# Patient Record
Sex: Female | Born: 1980
Health system: Southern US, Community
[De-identification: ages and names within clinical notes are randomized; demographics above are authoritative.]

## PROBLEM LIST (undated history)

## (undated) DIAGNOSIS — E669 Obesity, unspecified: Secondary | ICD-10-CM

## (undated) DIAGNOSIS — F32A Depression, unspecified: Secondary | ICD-10-CM

## (undated) DIAGNOSIS — K219 Gastro-esophageal reflux disease without esophagitis: Secondary | ICD-10-CM

## (undated) DIAGNOSIS — E785 Hyperlipidemia, unspecified: Secondary | ICD-10-CM

## (undated) DIAGNOSIS — I10 Essential (primary) hypertension: Secondary | ICD-10-CM

## (undated) DIAGNOSIS — L309 Dermatitis, unspecified: Secondary | ICD-10-CM

## (undated) DIAGNOSIS — F329 Major depressive disorder, single episode, unspecified: Secondary | ICD-10-CM

## (undated) HISTORY — DX: Gastro-esophageal reflux disease without esophagitis: K21.9

## (undated) HISTORY — DX: Depression, unspecified: F32.A

## (undated) HISTORY — DX: Dermatitis, unspecified: L30.9

## (undated) HISTORY — DX: Hyperlipidemia, unspecified: E78.5

## (undated) HISTORY — DX: Obesity, unspecified: E66.9

## (undated) HISTORY — DX: Essential (primary) hypertension: I10

## (undated) HISTORY — DX: Major depressive disorder, single episode, unspecified: F32.9

---
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
## 2001-09-10 ENCOUNTER — Emergency Department (HOSPITAL_COMMUNITY): Admission: EM | Admit: 2001-09-10 | Discharge: 2001-09-10 | Payer: Self-pay | Admitting: *Deleted

## 2003-02-10 ENCOUNTER — Emergency Department (HOSPITAL_COMMUNITY): Admission: EM | Admit: 2003-02-10 | Discharge: 2003-02-10 | Payer: Self-pay | Admitting: Emergency Medicine

## 2003-02-10 ENCOUNTER — Encounter: Payer: Self-pay | Admitting: Emergency Medicine

## 2004-05-06 ENCOUNTER — Emergency Department (HOSPITAL_COMMUNITY): Admission: EM | Admit: 2004-05-06 | Discharge: 2004-05-06 | Payer: Self-pay | Admitting: Emergency Medicine

## 2004-09-04 ENCOUNTER — Emergency Department (HOSPITAL_COMMUNITY): Admission: EM | Admit: 2004-09-04 | Discharge: 2004-09-05 | Payer: Self-pay | Admitting: *Deleted

## 2004-10-30 ENCOUNTER — Ambulatory Visit: Payer: Self-pay | Admitting: Internal Medicine

## 2004-11-21 ENCOUNTER — Ambulatory Visit: Payer: Self-pay | Admitting: Internal Medicine

## 2004-12-03 ENCOUNTER — Ambulatory Visit: Payer: Self-pay | Admitting: Internal Medicine

## 2005-01-23 ENCOUNTER — Emergency Department (HOSPITAL_COMMUNITY): Admission: EM | Admit: 2005-01-23 | Discharge: 2005-01-23 | Payer: Self-pay | Admitting: Emergency Medicine

## 2005-04-29 ENCOUNTER — Emergency Department (HOSPITAL_COMMUNITY): Admission: EM | Admit: 2005-04-29 | Discharge: 2005-04-29 | Payer: Self-pay | Admitting: Emergency Medicine

## 2005-05-27 ENCOUNTER — Ambulatory Visit: Payer: Self-pay | Admitting: Internal Medicine

## 2005-08-21 ENCOUNTER — Emergency Department (HOSPITAL_COMMUNITY): Admission: EM | Admit: 2005-08-21 | Discharge: 2005-08-21 | Payer: Self-pay | Admitting: *Deleted

## 2005-12-02 ENCOUNTER — Ambulatory Visit: Payer: Self-pay | Admitting: Internal Medicine

## 2005-12-25 ENCOUNTER — Emergency Department (HOSPITAL_COMMUNITY): Admission: EM | Admit: 2005-12-25 | Discharge: 2005-12-25 | Payer: Self-pay | Admitting: Emergency Medicine

## 2006-04-07 ENCOUNTER — Ambulatory Visit: Payer: Self-pay | Admitting: Internal Medicine

## 2006-06-10 ENCOUNTER — Ambulatory Visit (HOSPITAL_COMMUNITY): Admission: RE | Admit: 2006-06-10 | Discharge: 2006-06-10 | Payer: Self-pay | Admitting: Internal Medicine

## 2006-06-10 ENCOUNTER — Ambulatory Visit: Payer: Self-pay | Admitting: Internal Medicine

## 2006-09-04 DIAGNOSIS — F17200 Nicotine dependence, unspecified, uncomplicated: Secondary | ICD-10-CM

## 2006-09-04 DIAGNOSIS — I10 Essential (primary) hypertension: Secondary | ICD-10-CM | POA: Insufficient documentation

## 2006-09-04 DIAGNOSIS — K219 Gastro-esophageal reflux disease without esophagitis: Secondary | ICD-10-CM | POA: Insufficient documentation

## 2006-09-04 DIAGNOSIS — R0789 Other chest pain: Secondary | ICD-10-CM | POA: Insufficient documentation

## 2006-09-04 DIAGNOSIS — E785 Hyperlipidemia, unspecified: Secondary | ICD-10-CM | POA: Insufficient documentation

## 2006-11-01 ENCOUNTER — Emergency Department (HOSPITAL_COMMUNITY): Admission: EM | Admit: 2006-11-01 | Discharge: 2006-11-01 | Payer: Self-pay | Admitting: Emergency Medicine

## 2007-01-07 ENCOUNTER — Encounter (INDEPENDENT_AMBULATORY_CARE_PROVIDER_SITE_OTHER): Payer: Self-pay | Admitting: *Deleted

## 2007-01-07 ENCOUNTER — Ambulatory Visit: Payer: Self-pay | Admitting: Internal Medicine

## 2007-01-07 DIAGNOSIS — F329 Major depressive disorder, single episode, unspecified: Secondary | ICD-10-CM

## 2007-02-16 ENCOUNTER — Telehealth (INDEPENDENT_AMBULATORY_CARE_PROVIDER_SITE_OTHER): Payer: Self-pay | Admitting: *Deleted

## 2007-03-10 ENCOUNTER — Telehealth (INDEPENDENT_AMBULATORY_CARE_PROVIDER_SITE_OTHER): Payer: Self-pay | Admitting: *Deleted

## 2007-05-06 ENCOUNTER — Emergency Department (HOSPITAL_COMMUNITY): Admission: EM | Admit: 2007-05-06 | Discharge: 2007-05-06 | Payer: Self-pay | Admitting: Emergency Medicine

## 2007-06-09 ENCOUNTER — Ambulatory Visit: Payer: Self-pay | Admitting: Internal Medicine

## 2007-06-09 ENCOUNTER — Encounter (INDEPENDENT_AMBULATORY_CARE_PROVIDER_SITE_OTHER): Payer: Self-pay | Admitting: Internal Medicine

## 2007-06-09 ENCOUNTER — Encounter (INDEPENDENT_AMBULATORY_CARE_PROVIDER_SITE_OTHER): Payer: Self-pay | Admitting: *Deleted

## 2007-06-09 LAB — CONVERTED CEMR LAB
ALT: 24 units/L (ref 0–35)
AST: 19 units/L (ref 0–37)
Albumin: 4.7 g/dL (ref 3.5–5.2)
Alkaline Phosphatase: 77 units/L (ref 39–117)
BUN: 15 mg/dL (ref 6–23)
Bilirubin Urine: NEGATIVE
CO2: 23 meq/L (ref 19–32)
Calcium: 10.2 mg/dL (ref 8.4–10.5)
Chloride: 98 meq/L (ref 96–112)
Creatinine, Ser: 0.85 mg/dL (ref 0.40–1.20)
Glucose, Bld: 90 mg/dL (ref 70–99)
HCT: 47.2 % — ABNORMAL HIGH (ref 36.0–46.0)
Hemoglobin, Urine: NEGATIVE
Hemoglobin: 15.8 g/dL — ABNORMAL HIGH (ref 12.0–15.0)
MCHC: 33.5 g/dL (ref 30.0–36.0)
MCV: 88.7 fL (ref 78.0–100.0)
Nitrite: NEGATIVE
Platelets: 366 10*3/uL (ref 150–400)
Potassium: 4.6 meq/L (ref 3.5–5.3)
Protein, ur: 30 mg/dL — AB
RBC: 5.32 M/uL — ABNORMAL HIGH (ref 3.87–5.11)
RDW: 13.1 % (ref 11.5–14.0)
Sodium: 136 meq/L (ref 135–145)
Specific Gravity, Urine: 1.023 (ref 1.005–1.03)
Total Bilirubin: 0.8 mg/dL (ref 0.3–1.2)
Total Protein: 8 g/dL (ref 6.0–8.3)
Urine Glucose: NEGATIVE mg/dL
Urobilinogen, UA: 0.2 (ref 0.0–1.0)
WBC: 13.7 10*3/uL — ABNORMAL HIGH (ref 4.0–10.5)
pH: 5.5 (ref 5.0–8.0)

## 2007-09-25 ENCOUNTER — Ambulatory Visit: Payer: Self-pay | Admitting: Infectious Disease

## 2007-09-25 ENCOUNTER — Encounter (INDEPENDENT_AMBULATORY_CARE_PROVIDER_SITE_OTHER): Payer: Self-pay | Admitting: Internal Medicine

## 2007-09-25 DIAGNOSIS — J45909 Unspecified asthma, uncomplicated: Secondary | ICD-10-CM | POA: Insufficient documentation

## 2007-09-25 LAB — CONVERTED CEMR LAB
BUN: 10 mg/dL (ref 6–23)
CO2: 22 meq/L (ref 19–32)
Calcium: 9.2 mg/dL (ref 8.4–10.5)
Chloride: 105 meq/L (ref 96–112)
Creatinine, Ser: 0.77 mg/dL (ref 0.40–1.20)
Glucose, Bld: 94 mg/dL (ref 70–99)
Potassium: 4.4 meq/L (ref 3.5–5.3)
Sodium: 139 meq/L (ref 135–145)

## 2008-01-01 ENCOUNTER — Ambulatory Visit: Payer: Self-pay | Admitting: Infectious Disease

## 2008-01-01 ENCOUNTER — Encounter (INDEPENDENT_AMBULATORY_CARE_PROVIDER_SITE_OTHER): Payer: Self-pay | Admitting: *Deleted

## 2008-01-01 LAB — CONVERTED CEMR LAB
BUN: 13 mg/dL (ref 6–23)
CO2: 24 meq/L (ref 19–32)
Calcium: 9.1 mg/dL (ref 8.4–10.5)
Chloride: 108 meq/L (ref 96–112)
Creatinine, Ser: 0.84 mg/dL (ref 0.40–1.20)
Glucose, Bld: 91 mg/dL (ref 70–99)
Potassium: 4.9 meq/L (ref 3.5–5.3)
Sodium: 142 meq/L (ref 135–145)
TSH: 0.906 microintl units/mL (ref 0.350–5.50)

## 2008-01-12 ENCOUNTER — Ambulatory Visit: Payer: Self-pay | Admitting: Internal Medicine

## 2008-01-12 LAB — CONVERTED CEMR LAB: Blood Glucose, AC Bkfst: 113 mg/dL

## 2008-03-23 ENCOUNTER — Ambulatory Visit (HOSPITAL_COMMUNITY): Admission: RE | Admit: 2008-03-23 | Discharge: 2008-03-23 | Payer: Self-pay | Admitting: *Deleted

## 2008-03-23 ENCOUNTER — Encounter: Payer: Self-pay | Admitting: Internal Medicine

## 2008-03-23 ENCOUNTER — Ambulatory Visit: Payer: Self-pay | Admitting: Infectious Diseases

## 2008-03-23 ENCOUNTER — Encounter (INDEPENDENT_AMBULATORY_CARE_PROVIDER_SITE_OTHER): Payer: Self-pay | Admitting: Internal Medicine

## 2008-03-23 LAB — CONVERTED CEMR LAB
ALT: 32 units/L (ref 0–35)
AST: 27 units/L (ref 0–37)
Albumin: 4.4 g/dL (ref 3.5–5.2)
Alkaline Phosphatase: 70 units/L (ref 39–117)
BUN: 10 mg/dL (ref 6–23)
CK-MB: 4 ng/mL (ref 0.3–4.0)
CO2: 28 meq/L (ref 19–32)
Calcium: 9.8 mg/dL (ref 8.4–10.5)
Chloride: 100 meq/L (ref 96–112)
Cholesterol: 200 mg/dL (ref 0–200)
Creatinine, Ser: 0.84 mg/dL (ref 0.40–1.20)
Glucose, Bld: 103 mg/dL — ABNORMAL HIGH (ref 70–99)
HCT: 44.3 % (ref 36.0–46.0)
HDL: 33 mg/dL — ABNORMAL LOW (ref >39)
Hemoglobin: 15.1 g/dL — ABNORMAL HIGH (ref 12.0–15.0)
LDL Cholesterol: 107 mg/dL — ABNORMAL HIGH (ref 0–99)
MCHC: 34.1 g/dL (ref 30.0–36.0)
MCV: 87.5 fL (ref 78.0–100.0)
Platelets: 316 10*3/uL (ref 150–400)
Potassium: 4.7 meq/L (ref 3.5–5.3)
RBC: 5.07 M/uL (ref 3.87–5.11)
RDW: 13 % (ref 11.5–15.5)
Relative Index: 1.4 (ref 0.0–2.5)
Sodium: 137 meq/L (ref 135–145)
TSH: 1.012 microintl units/mL (ref 0.350–4.50)
Total Bilirubin: 0.6 mg/dL (ref 0.3–1.2)
Total CHOL/HDL Ratio: 6.1
Total CK: 293 units/L — ABNORMAL HIGH (ref 7–177)
Total Protein: 7.6 g/dL (ref 6.0–8.3)
Triglycerides: 300 mg/dL — ABNORMAL HIGH (ref <150)
Troponin I: <0.01 ng/mL (ref <0.06)
VLDL: 60 mg/dL — ABNORMAL HIGH (ref 0–40)
WBC: 13.3 10*3/uL — ABNORMAL HIGH (ref 4.0–10.5)

## 2008-04-11 ENCOUNTER — Emergency Department (HOSPITAL_COMMUNITY): Admission: EM | Admit: 2008-04-11 | Discharge: 2008-04-11 | Payer: Self-pay | Admitting: Emergency Medicine

## 2008-05-04 ENCOUNTER — Encounter (INDEPENDENT_AMBULATORY_CARE_PROVIDER_SITE_OTHER): Payer: Self-pay | Admitting: Internal Medicine

## 2008-05-04 ENCOUNTER — Ambulatory Visit: Payer: Self-pay | Admitting: Internal Medicine

## 2008-05-04 DIAGNOSIS — J309 Allergic rhinitis, unspecified: Secondary | ICD-10-CM | POA: Insufficient documentation

## 2008-05-05 LAB — CONVERTED CEMR LAB
ALT: 18 units/L (ref 0–35)
AST: 12 units/L (ref 0–37)
Albumin: 4.4 g/dL (ref 3.5–5.2)
Alkaline Phosphatase: 61 units/L (ref 39–117)
BUN: 9 mg/dL (ref 6–23)
Basophils Absolute: 0 10*3/uL (ref 0.0–0.1)
Basophils Relative: 0 % (ref 0–1)
CO2: 23 meq/L (ref 19–32)
Calcium: 9.3 mg/dL (ref 8.4–10.5)
Chloride: 104 meq/L (ref 96–112)
Creatinine, Ser: 0.76 mg/dL (ref 0.40–1.20)
Eosinophils Absolute: 0.4 10*3/uL (ref 0.0–0.7)
Eosinophils Relative: 3 % (ref 0–5)
Glucose, Bld: 76 mg/dL (ref 70–99)
HCT: 42.9 % (ref 36.0–46.0)
Hemoglobin, Urine: NEGATIVE
Hemoglobin: 14.1 g/dL (ref 12.0–15.0)
Leukocytes, UA: NEGATIVE
Lymphocytes Relative: 21 % (ref 12–46)
Lymphs Abs: 2.6 10*3/uL (ref 0.7–4.0)
MCHC: 32.9 g/dL (ref 30.0–36.0)
MCV: 89.7 fL (ref 78.0–100.0)
Monocytes Absolute: 1.3 10*3/uL — ABNORMAL HIGH (ref 0.1–1.0)
Monocytes Relative: 10 % (ref 3–12)
Neutro Abs: 8.1 10*3/uL — ABNORMAL HIGH (ref 1.7–7.7)
Neutrophils Relative %: 65 % (ref 43–77)
Nitrite: NEGATIVE
Platelets: 361 10*3/uL (ref 150–400)
Potassium: 3.6 meq/L (ref 3.5–5.3)
Protein, ur: NEGATIVE mg/dL
RBC / HPF: NONE SEEN (ref <3)
RBC: 4.78 M/uL (ref 3.87–5.11)
RDW: 13.7 % (ref 11.5–15.5)
Sodium: 140 meq/L (ref 135–145)
Specific Gravity, Urine: 1.026 (ref 1.005–1.03)
Total Bilirubin: 0.5 mg/dL (ref 0.3–1.2)
Total Protein: 7.3 g/dL (ref 6.0–8.3)
Urine Glucose: NEGATIVE mg/dL
Urobilinogen, UA: 1 (ref 0.0–1.0)
WBC, UA: NONE SEEN cells/hpf (ref <3)
WBC: 12.4 10*3/uL — ABNORMAL HIGH (ref 4.0–10.5)
pH: 6.5 (ref 5.0–8.0)

## 2008-09-27 ENCOUNTER — Telehealth (INDEPENDENT_AMBULATORY_CARE_PROVIDER_SITE_OTHER): Payer: Self-pay | Admitting: Internal Medicine

## 2008-10-03 ENCOUNTER — Ambulatory Visit: Payer: Self-pay | Admitting: Infectious Disease

## 2008-10-03 DIAGNOSIS — E669 Obesity, unspecified: Secondary | ICD-10-CM

## 2008-10-03 DIAGNOSIS — L259 Unspecified contact dermatitis, unspecified cause: Secondary | ICD-10-CM | POA: Insufficient documentation

## 2008-10-12 ENCOUNTER — Encounter (INDEPENDENT_AMBULATORY_CARE_PROVIDER_SITE_OTHER): Payer: Self-pay | Admitting: Internal Medicine

## 2008-10-12 ENCOUNTER — Ambulatory Visit (HOSPITAL_COMMUNITY): Admission: RE | Admit: 2008-10-12 | Discharge: 2008-10-12 | Payer: Self-pay | Admitting: Internal Medicine

## 2009-01-16 ENCOUNTER — Telehealth (INDEPENDENT_AMBULATORY_CARE_PROVIDER_SITE_OTHER): Payer: Self-pay | Admitting: Internal Medicine

## 2009-02-09 ENCOUNTER — Emergency Department (HOSPITAL_COMMUNITY): Admission: EM | Admit: 2009-02-09 | Discharge: 2009-02-09 | Payer: Self-pay | Admitting: Emergency Medicine

## 2009-02-13 ENCOUNTER — Ambulatory Visit: Payer: Self-pay | Admitting: Internal Medicine

## 2009-02-13 DIAGNOSIS — M25579 Pain in unspecified ankle and joints of unspecified foot: Secondary | ICD-10-CM

## 2009-03-16 ENCOUNTER — Ambulatory Visit: Payer: Self-pay | Admitting: Sports Medicine

## 2009-03-16 DIAGNOSIS — M25476 Effusion, unspecified foot: Secondary | ICD-10-CM

## 2009-03-16 DIAGNOSIS — M25473 Effusion, unspecified ankle: Secondary | ICD-10-CM

## 2009-03-16 DIAGNOSIS — M21619 Bunion of unspecified foot: Secondary | ICD-10-CM

## 2009-03-16 DIAGNOSIS — S82899A Other fracture of unspecified lower leg, initial encounter for closed fracture: Secondary | ICD-10-CM | POA: Insufficient documentation

## 2009-03-16 DIAGNOSIS — M2140 Flat foot [pes planus] (acquired), unspecified foot: Secondary | ICD-10-CM | POA: Insufficient documentation

## 2009-04-17 ENCOUNTER — Telehealth: Payer: Self-pay | Admitting: *Deleted

## 2009-05-10 ENCOUNTER — Encounter (INDEPENDENT_AMBULATORY_CARE_PROVIDER_SITE_OTHER): Payer: Self-pay | Admitting: *Deleted

## 2009-05-10 ENCOUNTER — Ambulatory Visit: Payer: Self-pay | Admitting: Sports Medicine

## 2009-08-25 ENCOUNTER — Ambulatory Visit: Payer: Self-pay | Admitting: Infectious Diseases

## 2009-08-25 DIAGNOSIS — N949 Unspecified condition associated with female genital organs and menstrual cycle: Secondary | ICD-10-CM

## 2009-08-25 DIAGNOSIS — N925 Other specified irregular menstruation: Secondary | ICD-10-CM | POA: Insufficient documentation

## 2009-08-25 LAB — CONVERTED CEMR LAB
Basophils Absolute: 0 10*3/uL (ref 0.0–0.1)
Basophils Relative: 1 % (ref 0–1)
Eosinophils Absolute: 0.5 10*3/uL (ref 0.0–0.7)
Eosinophils Relative: 6 % — ABNORMAL HIGH (ref 0–5)
HCT: 40.3 % (ref 36.0–46.0)
Hemoglobin: 13.4 g/dL (ref 12.0–15.0)
Lymphocytes Relative: 28 % (ref 12–46)
Lymphs Abs: 2.1 10*3/uL (ref 0.7–4.0)
MCHC: 33.3 g/dL (ref 30.0–36.0)
MCV: 86.1 fL (ref >=78.0)
Monocytes Absolute: 0.6 10*3/uL (ref 0.1–1.0)
Monocytes Relative: 9 % (ref 3–12)
Neutro Abs: 4.2 10*3/uL (ref 1.7–7.7)
Neutrophils Relative %: 57 % (ref 43–77)
Platelets: 382 10*3/uL (ref 150–400)
Prolactin: 6.7 ng/mL
RBC: 4.68 M/uL (ref 3.87–5.11)
RDW: 13.7 % (ref 11.5–15.5)
TSH: 0.76 microintl units/mL (ref 0.350–4.5)
WBC: 7.5 10*3/uL (ref 4.0–10.5)

## 2009-09-22 ENCOUNTER — Encounter: Admission: RE | Admit: 2009-09-22 | Discharge: 2009-09-22 | Payer: Self-pay | Admitting: Podiatrist

## 2010-01-24 ENCOUNTER — Telehealth (INDEPENDENT_AMBULATORY_CARE_PROVIDER_SITE_OTHER): Payer: Self-pay | Admitting: Internal Medicine

## 2010-01-24 ENCOUNTER — Emergency Department (HOSPITAL_COMMUNITY): Admission: EM | Admit: 2010-01-24 | Discharge: 2010-01-24 | Payer: Self-pay | Admitting: Emergency Medicine

## 2010-03-14 ENCOUNTER — Emergency Department (HOSPITAL_COMMUNITY): Admission: EM | Admit: 2010-03-14 | Discharge: 2010-03-14 | Payer: Self-pay | Admitting: Emergency Medicine

## 2010-04-30 ENCOUNTER — Emergency Department (HOSPITAL_COMMUNITY): Admission: EM | Admit: 2010-04-30 | Discharge: 2010-04-30 | Payer: Self-pay | Admitting: Emergency Medicine

## 2010-07-25 ENCOUNTER — Emergency Department (HOSPITAL_COMMUNITY): Admission: EM | Admit: 2010-07-25 | Discharge: 2010-07-25 | Payer: Self-pay | Admitting: Emergency Medicine

## 2010-09-28 ENCOUNTER — Ambulatory Visit: Admit: 2010-09-28 | Payer: Self-pay

## 2010-10-09 NOTE — Progress Notes (Signed)
Summary: chest pain/ hla  Phone Note Call from Patient   Summary of Call: pt calls and ask for appt, has had L chest pain, L arm numbness since 5/17, pt is referred to Ed asap, not to drive and call [phone] if needed, pt verb understanding Initial call taken by: Marin Roberts RN,  Jan 24, 2010 4:33 PM  Follow-up for Phone Call        Thank you. Follow-up by: Joaquin Courts  MD,  Jan 25, 2010 7:02 AM

## 2010-10-11 ENCOUNTER — Encounter: Payer: Self-pay | Admitting: Internal Medicine

## 2010-11-22 LAB — CBC
HCT: 41.3 % (ref 36.0–46.0)
Hemoglobin: 13.8 g/dL (ref 12.0–15.0)
MCH: 28.9 pg (ref 26.0–34.0)
MCHC: 33.4 g/dL (ref 30.0–36.0)
MCV: 86.4 fL (ref 78.0–100.0)
Platelets: 335 10*3/uL (ref 150–400)
RBC: 4.78 MIL/uL (ref 3.87–5.11)
RDW: 13.8 % (ref 11.5–15.5)
WBC: 13.2 10*3/uL — ABNORMAL HIGH (ref 4.0–10.5)

## 2010-11-22 LAB — DIFFERENTIAL
Basophils Absolute: 0 10*3/uL (ref 0.0–0.1)
Basophils Relative: 0 % (ref 0–1)
Eosinophils Absolute: 0.4 10*3/uL (ref 0.0–0.7)
Eosinophils Relative: 3 % (ref 0–5)
Lymphocytes Relative: 28 % (ref 12–46)
Lymphs Abs: 3.6 10*3/uL (ref 0.7–4.0)
Monocytes Absolute: 1.3 10*3/uL — ABNORMAL HIGH (ref 0.1–1.0)
Monocytes Relative: 10 % (ref 3–12)
Neutro Abs: 7.8 10*3/uL — ABNORMAL HIGH (ref 1.7–7.7)
Neutrophils Relative %: 59 % (ref 43–77)

## 2010-11-22 LAB — BASIC METABOLIC PANEL
BUN: 10 mg/dL (ref 6–23)
CO2: 27 mEq/L (ref 19–32)
Calcium: 9 mg/dL (ref 8.4–10.5)
Chloride: 109 mEq/L (ref 96–112)
Creatinine, Ser: 0.84 mg/dL (ref 0.4–1.2)
GFR calc Af Amer: >60 mL/min (ref >60)
GFR calc non Af Amer: >60 mL/min (ref >60)
Glucose, Bld: 104 mg/dL — ABNORMAL HIGH (ref 70–99)
Potassium: 4.1 mEq/L (ref 3.5–5.1)
Sodium: 140 mEq/L (ref 135–145)

## 2010-11-22 LAB — POCT CARDIAC MARKERS
CKMB, poc: <1 ng/mL — ABNORMAL LOW (ref 1.0–8.0)
Myoglobin, poc: 42 ng/mL (ref 12–200)
Troponin i, poc: <0.05 ng/mL (ref 0.00–0.09)

## 2010-11-25 LAB — URINALYSIS, ROUTINE W REFLEX MICROSCOPIC
Glucose, UA: NEGATIVE mg/dL
Hgb urine dipstick: NEGATIVE
Ketones, ur: 15 mg/dL — AB
Nitrite: NEGATIVE
Protein, ur: NEGATIVE mg/dL
Specific Gravity, Urine: 1.031 — ABNORMAL HIGH (ref 1.005–1.030)
Urobilinogen, UA: 1 mg/dL (ref 0.0–1.0)
pH: 5.5 (ref 5.0–8.0)

## 2010-11-25 LAB — POCT I-STAT, CHEM 8
Calcium, Ion: 1.14 mmol/L (ref 1.12–1.32)
Chloride: 105 mEq/L (ref 96–112)
HCT: 46 % (ref 36.0–46.0)
Sodium: 139 mEq/L (ref 135–145)
TCO2: 25 mmol/L (ref 0–100)

## 2010-11-25 LAB — CBC
HCT: 43.2 % (ref 36.0–46.0)
MCHC: 33.9 g/dL (ref 30.0–36.0)
Platelets: 310 10*3/uL (ref 150–400)
RDW: 14.8 % (ref 11.5–15.5)
WBC: 8.4 10*3/uL (ref 4.0–10.5)

## 2010-11-25 LAB — DIFFERENTIAL
Basophils Absolute: 0.1 10*3/uL (ref 0.0–0.1)
Basophils Relative: 1 % (ref 0–1)
Lymphocytes Relative: 21 % (ref 12–46)
Monocytes Absolute: 0.8 10*3/uL (ref 0.1–1.0)
Neutro Abs: 5.6 10*3/uL (ref 1.7–7.7)
Neutrophils Relative %: 66 % (ref 43–77)

## 2010-11-25 LAB — WET PREP, GENITAL
Clue Cells Wet Prep HPF POC: NONE SEEN
Trich, Wet Prep: NONE SEEN
Yeast Wet Prep HPF POC: NONE SEEN

## 2010-11-25 LAB — GC/CHLAMYDIA PROBE AMP, GENITAL
Chlamydia, DNA Probe: NEGATIVE
GC Probe Amp, Genital: NEGATIVE

## 2010-11-25 LAB — POCT PREGNANCY, URINE: Preg Test, Ur: NEGATIVE

## 2010-11-26 LAB — POCT CARDIAC MARKERS
CKMB, poc: <1 ng/mL — ABNORMAL LOW (ref 1.0–8.0)
Myoglobin, poc: 57.9 ng/mL (ref 12–200)

## 2010-11-26 LAB — BASIC METABOLIC PANEL
CO2: 22 mEq/L (ref 19–32)
Calcium: 8.6 mg/dL (ref 8.4–10.5)
Creatinine, Ser: 0.87 mg/dL (ref 0.4–1.2)
GFR calc Af Amer: >60 mL/min (ref >60)
GFR calc non Af Amer: >60 mL/min (ref >60)
Glucose, Bld: 88 mg/dL (ref 70–99)

## 2010-11-26 LAB — DIFFERENTIAL
Basophils Absolute: 0 10*3/uL (ref 0.0–0.1)
Basophils Relative: 1 % (ref 0–1)
Neutro Abs: 4 10*3/uL (ref 1.7–7.7)
Neutrophils Relative %: 65 % (ref 43–77)

## 2010-11-26 LAB — CBC
MCHC: 34.2 g/dL (ref 30.0–36.0)
RDW: 15 % (ref 11.5–15.5)

## 2011-06-21 LAB — CBC
HCT: 40.5
MCHC: 34.4
MCV: 86.8
Platelets: 287
RDW: 13.1

## 2011-06-21 LAB — DIFFERENTIAL
Basophils Absolute: 0
Basophils Relative: 0
Eosinophils Absolute: 0.3
Eosinophils Relative: 4
Lymphs Abs: 2

## 2012-03-19 ENCOUNTER — Encounter (HOSPITAL_COMMUNITY): Payer: Self-pay | Admitting: Emergency Medicine

## 2012-03-19 ENCOUNTER — Emergency Department (HOSPITAL_COMMUNITY): Payer: Self-pay

## 2012-03-19 ENCOUNTER — Emergency Department (HOSPITAL_COMMUNITY)
Admission: EM | Admit: 2012-03-19 | Discharge: 2012-03-19 | Disposition: A | Discharge status: Home / Self Care | Payer: Self-pay | Attending: Emergency Medicine | Admitting: Emergency Medicine

## 2012-03-19 DIAGNOSIS — K219 Gastro-esophageal reflux disease without esophagitis: Secondary | ICD-10-CM | POA: Insufficient documentation

## 2012-03-19 DIAGNOSIS — R0602 Shortness of breath: Secondary | ICD-10-CM | POA: Insufficient documentation

## 2012-03-19 DIAGNOSIS — E785 Hyperlipidemia, unspecified: Secondary | ICD-10-CM | POA: Insufficient documentation

## 2012-03-19 DIAGNOSIS — R079 Chest pain, unspecified: Secondary | ICD-10-CM | POA: Insufficient documentation

## 2012-03-19 DIAGNOSIS — R0682 Tachypnea, not elsewhere classified: Secondary | ICD-10-CM | POA: Insufficient documentation

## 2012-03-19 DIAGNOSIS — R059 Cough, unspecified: Secondary | ICD-10-CM | POA: Insufficient documentation

## 2012-03-19 DIAGNOSIS — F3289 Other specified depressive episodes: Secondary | ICD-10-CM | POA: Insufficient documentation

## 2012-03-19 DIAGNOSIS — R05 Cough: Secondary | ICD-10-CM | POA: Insufficient documentation

## 2012-03-19 DIAGNOSIS — J45901 Unspecified asthma with (acute) exacerbation: Secondary | ICD-10-CM | POA: Insufficient documentation

## 2012-03-19 DIAGNOSIS — I10 Essential (primary) hypertension: Secondary | ICD-10-CM | POA: Insufficient documentation

## 2012-03-19 DIAGNOSIS — F329 Major depressive disorder, single episode, unspecified: Secondary | ICD-10-CM | POA: Insufficient documentation

## 2012-03-19 MED ORDER — ALBUTEROL SULFATE (5 MG/ML) 0.5% IN NEBU
5.0000 mg | INHALATION_SOLUTION | Freq: Once | RESPIRATORY_TRACT | Status: AC
  Administered 2012-03-19: 5 mg via RESPIRATORY_TRACT

## 2012-03-19 MED ORDER — FLUTICASONE-SALMETEROL 250-50 MCG/DOSE IN AEPB: 1.0000 | INHALATION_SPRAY | Freq: Two times a day (BID) | RESPIRATORY_TRACT | Status: DC

## 2012-03-19 MED ORDER — ALBUTEROL SULFATE HFA 108 (90 BASE) MCG/ACT IN AERS
2.0000 | INHALATION_SPRAY | Freq: Once | RESPIRATORY_TRACT | Status: AC
  Administered 2012-03-19: 2 via RESPIRATORY_TRACT
  Filled 2012-03-19: qty 6.7

## 2012-03-19 MED ORDER — IPRATROPIUM BROMIDE 0.02 % IN SOLN
RESPIRATORY_TRACT | Status: AC
  Filled 2012-03-19: qty 2.5

## 2012-03-19 MED ORDER — AEROCHAMBER PLUS W/MASK MISC
Status: AC
  Administered 2012-03-19: 1
  Filled 2012-03-19: qty 1

## 2012-03-19 MED ORDER — PREDNISONE 20 MG PO TABS: 40.0000 mg | ORAL_TABLET | Freq: Every day | ORAL | Status: AC

## 2012-03-19 MED ORDER — IPRATROPIUM BROMIDE 0.02 % IN SOLN
0.5000 mg | Freq: Once | RESPIRATORY_TRACT | Status: AC
  Administered 2012-03-19: 0.5 mg via RESPIRATORY_TRACT

## 2012-03-19 MED ORDER — ALBUTEROL SULFATE HFA 108 (90 BASE) MCG/ACT IN AERS: 1.0000 | INHALATION_SPRAY | Freq: Four times a day (QID) | RESPIRATORY_TRACT | Status: DC | PRN

## 2012-03-19 MED ORDER — PREDNISONE 20 MG PO TABS
60.0000 mg | ORAL_TABLET | Freq: Once | ORAL | Status: AC
  Administered 2012-03-19: 60 mg via ORAL
  Filled 2012-03-19: qty 3

## 2012-03-19 MED ORDER — ALBUTEROL (5 MG/ML) CONTINUOUS INHALATION SOLN
INHALATION_SOLUTION | RESPIRATORY_TRACT | Status: AC
  Filled 2012-03-19: qty 40

## 2012-03-19 NOTE — ED Notes (Signed)
Pt awaiting prescription from pharmacy prior to discharge.

## 2012-03-19 NOTE — ED Provider Notes (Signed)
History   This chart was scribed for Raeford Razor, MD by Charolett Bumpers . The patient was seen in room TR11C/TR11C.    CSN: [id number]  Arrival date & time 03/19/12  1253   First MD Initiated Contact with Patient 03/19/12 1316      Chief Complaint  Patient presents with  . Asthma    (Consider location/radiation/quality/duration/timing/severity/associated sxs/prior treatment) HPI Brooke Hickman is a 31 y.o. female who presents to the Emergency Department complaining of intermittent, moderate SOB with an associated productive cough and wheezing since last night. Patient states that she has an asthma attack last night and reports tachypnea. Patient reports a h/o  asthma and emphysema. Pt reports no being able to afford medications and has not been taking medications for the past 2 years. Patient reports associated chest pain that is aggravated with coughing, described as tightness. Pt denies any fevers, chills, leg pain, or leg swelling. Patient reports smoking, but states that she is trying to quit. Pt denies any h/o blood clots. Pt reports some relief after receiving a breathing treatment in triage here in the ED.   Past Medical History  Diagnosis Date  . Hypertension   . Lipidemia   . GERD (gastroesophageal reflux disease)   . Eczema   . Obesity   . Depression   . Asthma     PFTs 2/10 FEV1/FVC 82%, lungs hypoerinflated, + response to bronchodilators    History reviewed. No pertinent past surgical history.  Family History  Problem Relation Age of Onset  . Diabetes Mother   . Hypertension Mother   . Depression Mother   . Diabetes Maternal Grandmother     History  Substance Use Topics  . Smoking status: Current Everyday Smoker -- 1.0 packs/day    Types: Cigarettes  . Smokeless tobacco: Not on file  . Alcohol Use: No    OB History    Grav Para Term Preterm Abortions TAB SAB Ect Mult Living                  Review of Systems  Constitutional: Negative for  fever and chills.  Respiratory: Positive for cough, chest tightness, shortness of breath and wheezing.   Cardiovascular: Positive for chest pain. Negative for leg swelling.    Allergies  Hydrocodone-acetaminophen  Home Medications  No current outpatient prescriptions on file.  BP 156/90  Pulse 97  Temp 98.3 F (36.8 C) (Oral)  Resp 22  SpO2 95%  LMP 03/19/2012  Physical Exam  Nursing note and vitals reviewed. Constitutional: She is oriented to person, place, and time. She appears well-developed and well-nourished. No distress.       Talking in full sentences.   HENT:  Head: Normocephalic and atraumatic.  Eyes: EOM are normal.  Neck: Normal range of motion. Neck supple. No tracheal deviation present.  Cardiovascular: Regular rhythm and normal heart sounds.  Tachycardia present.   No murmur heard.      Mild tachycardia.   Pulmonary/Chest: Effort normal. No respiratory distress. She has wheezes.       Bilaterally expiratory wheezes.   Musculoskeletal: Normal range of motion. She exhibits no edema.       No calf tenderness or lower extremity edema.   Neurological: She is alert and oriented to person, place, and time.  Skin: Skin is warm and dry.  Psychiatric: She has a normal mood and affect. Her behavior is normal.    ED Course  Procedures (including critical care time)  DIAGNOSTIC STUDIES: Oxygen  Saturation is 95% on room air, adequate by my interpretation.    COORDINATION OF CARE:  1332: Discussed planned course of treatment with the patient, who is agreeable at this time. Informed pt of negative chest x-ray. Will start pt on steroids and d/c with inhaler.  1345: Medication Orders: Albuterol (Prventil HFA; Ventolin HFA) 108 (90 base) mcg/act inhaler 2 puff-once; Prednisone (Deltasone) tablet 60 mg-once.     Labs Reviewed - No data to display Dg Chest 2 View  03/19/2012  *RADIOLOGY REPORT*  Clinical Data: Shortness of breath, chest pain, history asthma, smoking,  hypertension  CHEST - 2 VIEW  Comparison: 07/25/2010  Findings: Normal heart size, mediastinal contours, and pulmonary vascularity. Minimal chronic bronchitic changes. Lungs otherwise clear. No pleural effusion or pneumothorax. No acute bony abnormalities.  IMPRESSION: Minimal chronic bronchitic changes which could be related to asthma, question slightly improved versus previous exam. No acute infiltrate.  Original Report Authenticated By: Lollie Marrow, M.D.     1. Asthma exacerbation       MDM  31 year old female with shortness of breath. Clinical picture consistent with an asthma exacerbation. Symptoms much improved with treatment in emergency room. Patient is afebrile. Chest x-ray doesn't show any concerning focal infiltrate. Doubt pulmonary embolism. Concerns fpr patient's inability to obtain respiratory medications secondary to financial issues. Case management was consulted. Will be able to help patient obtain her Advair. She was given an albuterol inhaler to go home with with an aerochamber. She should be able to afford the prednisone by herself. Return precautions were discussed. Outpatient followup.  I personally preformed the services scribed in my presence. The recorded information has been reviewed and considered. Raeford Razor, MD.        Raeford Razor, MD 03/19/12 [phone]

## 2012-03-19 NOTE — ED Notes (Signed)
Pt here for asthma and SOB; pt noted to have auditory wheezing; pt tachypnic at present; pt sts hx of same and has not had meds x 2 years due to financial issues

## 2012-03-24 NOTE — Progress Notes (Signed)
Consulted to provide medication assistance to patient. Per pharmacy she is eligible for the [employer]. Provided patient with medications, prescription discount card, applications via [employer] for Advair and Albuterol inhaler. Also referred patient to [facility] and had Felicia meet with her since she does reside within [facility]. Also advised patient of the use of the [facility] on [address] [phone] copay, physician visit, no labs or x-rays). Patient quite appreciative of services provided.

## 2012-05-16 ENCOUNTER — Emergency Department (HOSPITAL_COMMUNITY)
Admission: EM | Admit: 2012-05-16 | Discharge: 2012-05-16 | Disposition: A | Discharge status: Home / Self Care | Payer: Self-pay | Attending: Emergency Medicine | Admitting: Emergency Medicine

## 2012-05-16 ENCOUNTER — Encounter (HOSPITAL_COMMUNITY): Payer: Self-pay | Admitting: Emergency Medicine

## 2012-05-16 ENCOUNTER — Emergency Department (HOSPITAL_COMMUNITY): Payer: Self-pay

## 2012-05-16 DIAGNOSIS — E669 Obesity, unspecified: Secondary | ICD-10-CM | POA: Insufficient documentation

## 2012-05-16 DIAGNOSIS — K219 Gastro-esophageal reflux disease without esophagitis: Secondary | ICD-10-CM | POA: Insufficient documentation

## 2012-05-16 DIAGNOSIS — Z720 Tobacco use: Secondary | ICD-10-CM

## 2012-05-16 DIAGNOSIS — Z8249 Family history of ischemic heart disease and other diseases of the circulatory system: Secondary | ICD-10-CM | POA: Insufficient documentation

## 2012-05-16 DIAGNOSIS — J069 Acute upper respiratory infection, unspecified: Secondary | ICD-10-CM | POA: Insufficient documentation

## 2012-05-16 DIAGNOSIS — F17200 Nicotine dependence, unspecified, uncomplicated: Secondary | ICD-10-CM | POA: Insufficient documentation

## 2012-05-16 DIAGNOSIS — Z833 Family history of diabetes mellitus: Secondary | ICD-10-CM | POA: Insufficient documentation

## 2012-05-16 DIAGNOSIS — Z818 Family history of other mental and behavioral disorders: Secondary | ICD-10-CM | POA: Insufficient documentation

## 2012-05-16 DIAGNOSIS — F3289 Other specified depressive episodes: Secondary | ICD-10-CM | POA: Insufficient documentation

## 2012-05-16 DIAGNOSIS — J45909 Unspecified asthma, uncomplicated: Secondary | ICD-10-CM | POA: Insufficient documentation

## 2012-05-16 DIAGNOSIS — Z885 Allergy status to narcotic agent status: Secondary | ICD-10-CM | POA: Insufficient documentation

## 2012-05-16 DIAGNOSIS — F329 Major depressive disorder, single episode, unspecified: Secondary | ICD-10-CM | POA: Insufficient documentation

## 2012-05-16 DIAGNOSIS — I10 Essential (primary) hypertension: Secondary | ICD-10-CM | POA: Insufficient documentation

## 2012-05-16 MED ORDER — IPRATROPIUM BROMIDE 0.02 % IN SOLN
0.5000 mg | Freq: Once | RESPIRATORY_TRACT | Status: AC
  Administered 2012-05-16: 0.5 mg via RESPIRATORY_TRACT
  Filled 2012-05-16: qty 2.5

## 2012-05-16 MED ORDER — ALBUTEROL SULFATE (5 MG/ML) 0.5% IN NEBU
5.0000 mg | INHALATION_SOLUTION | Freq: Once | RESPIRATORY_TRACT | Status: AC
  Administered 2012-05-16: 5 mg via RESPIRATORY_TRACT
  Filled 2012-05-16: qty 1

## 2012-05-16 MED ORDER — BENZONATATE 100 MG PO CAPS: 100.0000 mg | ORAL_CAPSULE | Freq: Three times a day (TID) | ORAL | Status: AC

## 2012-05-16 MED ORDER — ALBUTEROL SULFATE HFA 108 (90 BASE) MCG/ACT IN AERS
2.0000 | INHALATION_SPRAY | RESPIRATORY_TRACT | Status: DC | PRN
  Administered 2012-05-16: 2 via RESPIRATORY_TRACT

## 2012-05-16 MED ORDER — ALBUTEROL SULFATE HFA 108 (90 BASE) MCG/ACT IN AERS
INHALATION_SPRAY | RESPIRATORY_TRACT | Status: AC
  Filled 2012-05-16: qty 6.7

## 2012-05-16 MED ORDER — AZITHROMYCIN 250 MG PO TABS: 250.0000 mg | ORAL_TABLET | Freq: Every day | ORAL | Status: AC

## 2012-05-16 MED ORDER — ALBUTEROL SULFATE (5 MG/ML) 0.5% IN NEBU
5.0000 mg | INHALATION_SOLUTION | Freq: Once | RESPIRATORY_TRACT | Status: AC
  Administered 2012-05-16: 5 mg via RESPIRATORY_TRACT
  Filled 2012-05-16: qty 40

## 2012-05-16 MED ORDER — FLUTICASONE-SALMETEROL 250-50 MCG/DOSE IN AEPB: 1.0000 | INHALATION_SPRAY | Freq: Every day | RESPIRATORY_TRACT | Status: DC

## 2012-05-16 MED ORDER — PREDNISONE 20 MG PO TABS
60.0000 mg | ORAL_TABLET | Freq: Once | ORAL | Status: AC
  Administered 2012-05-16: 60 mg via ORAL
  Filled 2012-05-16: qty 3

## 2012-05-16 MED ORDER — ALBUTEROL SULFATE (5 MG/ML) 0.5% IN NEBU
2.5000 mg | INHALATION_SOLUTION | Freq: Once | RESPIRATORY_TRACT | Status: AC
  Administered 2012-05-16: 2.5 mg via RESPIRATORY_TRACT
  Filled 2012-05-16: qty 0.5

## 2012-05-16 MED ORDER — PREDNISONE 20 MG PO TABS: 60.0000 mg | ORAL_TABLET | Freq: Every day | ORAL | Status: DC

## 2012-05-16 NOTE — ED Notes (Signed)
Pt. Stated, I've had a cold and now its my asthma and I'm really SOB.  It started 3 days ago.

## 2012-05-16 NOTE — ED Notes (Signed)
Patient transported to X-ray 

## 2012-05-16 NOTE — ED Provider Notes (Signed)
History     CSN: [id number]  Arrival date & time 05/16/12  1478   First MD Initiated Contact with Patient 05/16/12 [phone]      Chief Complaint  Patient presents with  . Shortness of Breath    (Consider location/radiation/quality/duration/timing/severity/associated sxs/prior treatment) Patient is a 31 y.o. female presenting with shortness of breath. The history is provided by the patient.  Shortness of Breath  The current episode started 3 to 5 days ago. The problem has been gradually worsening. Associated symptoms include shortness of breath and wheezing. Pertinent negatives include no chest pain and no fever. Associated symptoms comments: Patient with a history of asthma has had URI symptoms of congestion, hot/cold chills, cough and wheezing for several days. She continues to smoke. She has had nausea and diarrhea but no vomiting for the same time frame. She reports being out of her Advair for weeks. . Her past medical history is significant for asthma.    Past Medical History  Diagnosis Date  . Hypertension   . Lipidemia   . GERD (gastroesophageal reflux disease)   . Eczema   . Obesity   . Depression   . Asthma     PFTs 2/10 FEV1/FVC 82%, lungs hypoerinflated, + response to bronchodilators    History reviewed. No pertinent past surgical history.  Family History  Problem Relation Age of Onset  . Diabetes Mother   . Hypertension Mother   . Depression Mother   . Diabetes Maternal Grandmother     History  Substance Use Topics  . Smoking status: Current Everyday Smoker -- 1.0 packs/day    Types: Cigarettes  . Smokeless tobacco: Not on file  . Alcohol Use: No    OB History    Grav Para Term Preterm Abortions TAB SAB Ect Mult Living                  Review of Systems  Constitutional: Positive for chills. Negative for fever.  Eyes: Negative for discharge.  Respiratory: Positive for shortness of breath and wheezing.   Cardiovascular: Negative for chest pain.    Gastrointestinal: Positive for nausea. Negative for vomiting and abdominal pain.  Skin: Negative for rash.    Allergies  Hydrocodone-acetaminophen  Home Medications   Current Outpatient Rx  Name Route Sig Dispense Refill  . ALBUTEROL SULFATE HFA 108 (90 BASE) MCG/ACT IN AERS Inhalation Inhale 1-2 puffs into the lungs every 6 (six) hours as needed for wheezing. 1 Inhaler 1  . FLUTICASONE-SALMETEROL 250-50 MCG/DOSE IN AEPB Inhalation Inhale 1 puff into the lungs 2 (two) times daily. 60 each 1    BP 167/95  Pulse 93  Temp 97.6 F (36.4 C) (Oral)  Resp 22  SpO2 94%  LMP 05/02/2012  Physical Exam  Constitutional: She is oriented to person, place, and time. She appears well-developed and well-nourished.  HENT:  Head: Normocephalic.  Nose: Mucosal edema present.  Mouth/Throat: Mucous membranes are normal. Posterior oropharyngeal erythema present. No posterior oropharyngeal edema.  Neck: Normal range of motion. Neck supple.  Cardiovascular: Normal rate and regular rhythm.   Pulmonary/Chest: Effort normal. No respiratory distress. She has wheezes. She has rales. She exhibits no tenderness.       Expiratory wheezes on exam, scattered rales.   Abdominal: Soft. Bowel sounds are normal. There is no tenderness. There is no rebound and no guarding.  Musculoskeletal: Normal range of motion.  Neurological: She is alert and oriented to person, place, and time.  Skin: Skin is warm and dry.  No rash noted.  Psychiatric: She has a normal mood and affect.    ED Course  Procedures (including critical care time)  Labs Reviewed - No data to display No results found.  Dg Chest 2 View  05/16/2012  *RADIOLOGY REPORT*  Clinical Data: Shortness of breath  CHEST - 2 VIEW  Comparison: Multiple prior chest radiographs, most recent 03/19/2012  Findings: Normal heart, mediastinal, and hilar contours.  The trachea is midline.  Pulmonary vascularity is normal.  Possible 12 mm nodular density adjacent to  the left heart border.  This was not present on recent prior chest radiograph of 03/19/2012.  No visible pleural effusion or pneumothorax.  No acute osseous abnormality.  IMPRESSION: 12 mm nodular density adjacent to the left heart border was not present on recent prior chest radiograph.  This could reflect focal atelectasis or overlap of pulmonary vessels.  True pulmonary nodule cannot be completely excluded.  Short-term follow-up chest radiograph is suggested.   Original Report Authenticated By: Britta Mccreedy, M.D.    No diagnosis found.  1. URI 2. Asthma 3. Tobacco Abuse 4. Hypertension  MDM  Patient improving with nebulizer treatments. CXR without PNA. Anticipate discharge home after 3 neb treatments with albuterol inhaler and Rx for Advair. Patient has a history of hypertension and noncompliance with medications.        Rodena Medin, PA-C 05/16/12 1150

## 2012-05-19 NOTE — ED Provider Notes (Signed)
Medical screening examination/treatment/procedure(s) were performed by non-physician practitioner and as supervising physician I was immediately available for consultation/collaboration.  Juliet Rude. Rubin Payor, MD 05/19/12 2004

## 2012-06-03 ENCOUNTER — Emergency Department (HOSPITAL_COMMUNITY)
Admission: EM | Admit: 2012-06-03 | Discharge: 2012-06-03 | Disposition: A | Discharge status: Home / Self Care | Payer: Self-pay | Attending: Emergency Medicine | Admitting: Emergency Medicine

## 2012-06-03 ENCOUNTER — Encounter (HOSPITAL_COMMUNITY): Payer: Self-pay | Admitting: Emergency Medicine

## 2012-06-03 DIAGNOSIS — J069 Acute upper respiratory infection, unspecified: Secondary | ICD-10-CM | POA: Insufficient documentation

## 2012-06-03 DIAGNOSIS — R062 Wheezing: Secondary | ICD-10-CM

## 2012-06-03 DIAGNOSIS — I10 Essential (primary) hypertension: Secondary | ICD-10-CM | POA: Insufficient documentation

## 2012-06-03 DIAGNOSIS — J45909 Unspecified asthma, uncomplicated: Secondary | ICD-10-CM | POA: Insufficient documentation

## 2012-06-03 LAB — CBC WITH DIFFERENTIAL/PLATELET
Eosinophils Absolute: 0.4 10*3/uL (ref 0.0–0.7)
Eosinophils Relative: 4 % (ref 0–5)
HCT: 42.6 % (ref 36.0–46.0)
Hemoglobin: 14.8 g/dL (ref 12.0–15.0)
Lymphs Abs: 2.6 10*3/uL (ref 0.7–4.0)
MCH: 29.4 pg (ref 26.0–34.0)
MCV: 84.7 fL (ref 78.0–100.0)
Monocytes Relative: 11 % (ref 3–12)
Neutrophils Relative %: 61 % (ref 43–77)
RBC: 5.03 MIL/uL (ref 3.87–5.11)

## 2012-06-03 MED ORDER — AMOXICILLIN 500 MG PO CAPS: 500.0000 mg | ORAL_CAPSULE | Freq: Three times a day (TID) | ORAL | Status: DC

## 2012-06-03 MED ORDER — ALBUTEROL SULFATE HFA 108 (90 BASE) MCG/ACT IN AERS: 2.0000 | INHALATION_SPRAY | RESPIRATORY_TRACT | Status: DC | PRN

## 2012-06-03 MED ORDER — AEROCHAMBER Z-STAT PLUS/MEDIUM MISC
1.0000 | Freq: Once | Status: AC
  Administered 2012-06-03: 1
  Filled 2012-06-03: qty 1

## 2012-06-03 MED ORDER — IPRATROPIUM BROMIDE 0.02 % IN SOLN
0.5000 mg | Freq: Once | RESPIRATORY_TRACT | Status: AC
  Administered 2012-06-03: 0.5 mg via RESPIRATORY_TRACT
  Filled 2012-06-03 (×2): qty 2.5

## 2012-06-03 MED ORDER — ALBUTEROL SULFATE (5 MG/ML) 0.5% IN NEBU
5.0000 mg | INHALATION_SOLUTION | Freq: Once | RESPIRATORY_TRACT | Status: AC
  Administered 2012-06-03: 5 mg via RESPIRATORY_TRACT
  Filled 2012-06-03 (×3): qty 0.5

## 2012-06-03 MED ORDER — PREDNISONE 20 MG PO TABS: ORAL_TABLET | ORAL | Status: DC

## 2012-06-03 MED ORDER — ALBUTEROL SULFATE HFA 108 (90 BASE) MCG/ACT IN AERS
INHALATION_SPRAY | RESPIRATORY_TRACT | Status: AC
  Administered 2012-06-03: 22:00:00
  Filled 2012-06-03: qty 6.7

## 2012-06-03 MED ORDER — PREDNISONE 20 MG PO TABS
60.0000 mg | ORAL_TABLET | Freq: Once | ORAL | Status: AC
  Administered 2012-06-03: 60 mg via ORAL
  Filled 2012-06-03: qty 3

## 2012-06-03 NOTE — ED Provider Notes (Signed)
Medical screening examination/treatment/procedure(s) were performed by non-physician practitioner and as supervising physician I was immediately available for consultation/collaboration.  Donnetta Hutching, MD 06/03/12 2249

## 2012-06-03 NOTE — ED Notes (Signed)
Rt notified regarding neb treatment order.

## 2012-06-03 NOTE — ED Provider Notes (Signed)
History     CSN: [id number]  Arrival date & time 06/03/12  1629   First MD Initiated Contact with Patient 06/03/12 1753      Chief Complaint  Patient presents with  . Nasal Congestion    (Consider location/radiation/quality/duration/timing/severity/associated sxs/prior treatment) HPI Comments: Patient presents with complaint of cough, cold symptoms, and wheezing for the past month. Patient was seen for the same symptoms several weeks ago at [facility] emergency department. At that time she received several breathing treatments, a course of steroids. These measures helped temporarily however upon discontinuation her symptoms returned. She was also prescribed a prescription for Advair and prescription for an antibiotic, both of which she cannot fill due to financial reasons. Symptoms continue. Patient has been using albuterol inhaler 6-7 times a day. She also describes nausea but infrequent vomiting. She denies having fever, sore throat. Patient states she has had some loose stools recently but it is nonbloody.  The history is provided by the patient.    Past Medical History  Diagnosis Date  . Hypertension   . Lipidemia   . GERD (gastroesophageal reflux disease)   . Eczema   . Obesity   . Depression   . Asthma     PFTs 2/10 FEV1/FVC 82%, lungs hypoerinflated, + response to bronchodilators    No past surgical history on file.  Family History  Problem Relation Age of Onset  . Diabetes Mother   . Hypertension Mother   . Depression Mother   . Diabetes Maternal Grandmother     History  Substance Use Topics  . Smoking status: Current Every Day Smoker -- 1.0 packs/day    Types: Cigarettes  . Smokeless tobacco: Not on file  . Alcohol Use: No    OB History    Grav Para Term Preterm Abortions TAB SAB Ect Mult Living                  Review of Systems  Constitutional: Negative for fever and chills.  HENT: Positive for congestion and rhinorrhea. Negative for sore throat.    Eyes: Negative for redness.  Respiratory: Positive for cough and wheezing.   Cardiovascular: Negative for chest pain.  Gastrointestinal: Positive for nausea. Negative for vomiting, abdominal pain and diarrhea.  Genitourinary: Negative for dysuria.  Musculoskeletal: Negative for myalgias.  Skin: Negative for rash.  Neurological: Negative for headaches.    Allergies  Review of patient's allergies indicates no active allergies.  Home Medications   Current Outpatient Rx  Name Route Sig Dispense Refill  . ALBUTEROL SULFATE HFA 108 (90 BASE) MCG/ACT IN AERS Inhalation Inhale 1-2 puffs into the lungs every 6 (six) hours as needed for wheezing. 1 Inhaler 1  . BENZONATATE 100 MG PO CAPS Oral Take 100 mg by mouth 3 (three) times daily as needed. cough    . IBUPROFEN 200 MG PO TABS Oral Take 800 mg by mouth every 6 (six) hours as needed. pain      BP 148/116  Pulse 95  Temp 98 F (36.7 C) (Oral)  Resp 20  Wt 240 lb (108.863 kg)  SpO2 97%  LMP 05/02/2012  Physical Exam  Nursing note and vitals reviewed. Constitutional: She appears well-developed and well-nourished.  HENT:  Head: Normocephalic and atraumatic.  Eyes: Conjunctivae normal are normal. Right eye exhibits no discharge. Left eye exhibits no discharge.  Neck: Normal range of motion. Neck supple.  Cardiovascular: Normal rate, regular rhythm and normal heart sounds.   Pulmonary/Chest: Effort normal. No respiratory distress.  She has wheezes (all fields). She has no rales.  Abdominal: Soft. There is no tenderness.  Lymphadenopathy:    She has no cervical adenopathy.  Neurological: She is alert.  Skin: Skin is warm and dry.  Psychiatric: She has a normal mood and affect.    ED Course  Procedures (including critical care time)  Labs Reviewed  CBC WITH DIFFERENTIAL - Abnormal; Notable for the following:    WBC 10.7 (*)     Monocytes Absolute 1.1 (*)     All other components within normal limits   No results  found.   1. Wheezing   2. URI, acute     6:35 PM Patient seen and examined. Work-up initiated. Medications ordered.   Vital signs reviewed and are as follows: Filed Vitals:   06/03/12 1733  BP: 148/116  Pulse: 95  Temp: 98 F (36.7 C)  Resp: 20   Patient received breathing treatment and had significant improvement. She continues to wheeze but she states she is feeling better and wants to leave.  Albuterol inhaler given prior to discharge. Patient counseled on use of albuterol HFA.  Told to use 1-2 puffs q 4 hours as needed for SOB.  Prednisone given prior to discharge.  Amoxicillin prescribed for lung infection. Patient cannot afford other antibiotics.  Patient urged to return with high persistent fever, worsening shortness of breath or trouble breathing, chest pain, or she has any other concerns. Patient verbalizes understanding recent plan.   MDM  Patients with signs and symptoms consistent with bronchitis. She has had symptoms for 4 weeks. She was unable to fill her antibiotics at prior visits so I prescribed amoxicillin which she should be able to afford. She was given breathing treatment with significant improvement. She is afebrile in emergency department. I doubt that she has developed pneumonia. I believe that she has asthma with superimposed bronchitis. Will treat with a longer course of steroids. Patient appears well at time of discharge.       Renne Crigler, [place] 06/03/12 2159

## 2012-06-03 NOTE — ED Notes (Signed)
Pt reports she has had cough and cold symptoms for about a month.  She was seen at [facility] two weeks ago for same and was treated with prednisone, breathing treatments, and a cough suppressant. She was also given a prescription for an antibiotic which she was not able to get filled for financial reasons.  She returns with same symptoms and also reports nausea and one episode of vomiting today.  Pt denies chills and fever.  Also denies sore throat.

## 2012-06-03 NOTE — ED Notes (Signed)
RT therapy notified x2 regarding treatment order.

## 2013-07-19 ENCOUNTER — Encounter (HOSPITAL_COMMUNITY): Payer: Self-pay | Admitting: Emergency Medicine

## 2013-07-19 ENCOUNTER — Emergency Department (HOSPITAL_COMMUNITY): Payer: Self-pay

## 2013-07-19 ENCOUNTER — Emergency Department (HOSPITAL_COMMUNITY)
Admission: EM | Admit: 2013-07-19 | Discharge: 2013-07-19 | Disposition: A | Discharge status: Home / Self Care | Payer: Self-pay | Attending: Emergency Medicine | Admitting: Emergency Medicine

## 2013-07-19 DIAGNOSIS — J45901 Unspecified asthma with (acute) exacerbation: Secondary | ICD-10-CM | POA: Insufficient documentation

## 2013-07-19 DIAGNOSIS — Z8639 Personal history of other endocrine, nutritional and metabolic disease: Secondary | ICD-10-CM | POA: Insufficient documentation

## 2013-07-19 DIAGNOSIS — Z8719 Personal history of other diseases of the digestive system: Secondary | ICD-10-CM | POA: Insufficient documentation

## 2013-07-19 DIAGNOSIS — IMO0002 Reserved for concepts with insufficient information to code with codable children: Secondary | ICD-10-CM | POA: Insufficient documentation

## 2013-07-19 DIAGNOSIS — F17200 Nicotine dependence, unspecified, uncomplicated: Secondary | ICD-10-CM | POA: Insufficient documentation

## 2013-07-19 DIAGNOSIS — Z872 Personal history of diseases of the skin and subcutaneous tissue: Secondary | ICD-10-CM | POA: Insufficient documentation

## 2013-07-19 DIAGNOSIS — Z862 Personal history of diseases of the blood and blood-forming organs and certain disorders involving the immune mechanism: Secondary | ICD-10-CM | POA: Insufficient documentation

## 2013-07-19 DIAGNOSIS — I10 Essential (primary) hypertension: Secondary | ICD-10-CM | POA: Insufficient documentation

## 2013-07-19 DIAGNOSIS — E669 Obesity, unspecified: Secondary | ICD-10-CM | POA: Insufficient documentation

## 2013-07-19 DIAGNOSIS — Z792 Long term (current) use of antibiotics: Secondary | ICD-10-CM | POA: Insufficient documentation

## 2013-07-19 DIAGNOSIS — Z8659 Personal history of other mental and behavioral disorders: Secondary | ICD-10-CM | POA: Insufficient documentation

## 2013-07-19 MED ORDER — ALBUTEROL SULFATE HFA 108 (90 BASE) MCG/ACT IN AERS
2.0000 | INHALATION_SPRAY | Freq: Once | RESPIRATORY_TRACT | Status: AC
  Administered 2013-07-19: 2 via RESPIRATORY_TRACT
  Filled 2013-07-19: qty 6.7

## 2013-07-19 MED ORDER — AZITHROMYCIN 250 MG PO TABS: ORAL_TABLET | ORAL | Status: AC

## 2013-07-19 MED ORDER — IPRATROPIUM BROMIDE 0.02 % IN SOLN
0.5000 mg | Freq: Once | RESPIRATORY_TRACT | Status: AC
  Administered 2013-07-19: 0.5 mg via RESPIRATORY_TRACT
  Filled 2013-07-19: qty 2.5

## 2013-07-19 MED ORDER — ALBUTEROL SULFATE (5 MG/ML) 0.5% IN NEBU
5.0000 mg | INHALATION_SOLUTION | Freq: Once | RESPIRATORY_TRACT | Status: AC
  Administered 2013-07-19: 5 mg via RESPIRATORY_TRACT
  Filled 2013-07-19: qty 1

## 2013-07-19 MED ORDER — METHYLPREDNISOLONE SODIUM SUCC 125 MG IJ SOLR
125.0000 mg | Freq: Once | INTRAMUSCULAR | Status: AC
  Administered 2013-07-19: 125 mg via INTRAVENOUS
  Filled 2013-07-19: qty 2

## 2013-07-19 MED ORDER — PREDNISONE 20 MG PO TABS: ORAL_TABLET | ORAL | Status: AC

## 2013-07-19 NOTE — ED Provider Notes (Signed)
CSN: [id number]     Arrival date & time 07/19/13  0559 History   First MD Initiated Contact with Patient 07/19/13 0606     Chief Complaint  Patient presents with  . Asthma   (Consider location/radiation/quality/duration/timing/severity/associated sxs/prior Treatment) HPI Comments: Patient is a 32 year old female history of hypertension, lipidemia, GERD, eczema, obesity, depression, asthma who presents today with 2 days of worsening shortness of breath and productive cough. She reports that this feels like her normal asthma exacerbation. She has wheezes. She is supposed to take Advair, but reports she cannot afford the Advair and has not taken any medications for her shortness of breath. When flat does make her shortness of breath worse. Her cough is worse at night. She has been hospitalized for her asthma in the past due to his low oxygen saturations, but has never been intubated. She denies any fevers, nausea, vomiting, abdominal pain, diaphoresis.  The history is provided by the patient. No language interpreter was used.    Past Medical History  Diagnosis Date  . Hypertension   . Lipidemia   . GERD (gastroesophageal reflux disease)   . Eczema   . Obesity   . Depression   . Asthma     PFTs 2/10 FEV1/FVC 82%, lungs hypoerinflated, + response to bronchodilators   No past surgical history on file. Family History  Problem Relation Age of Onset  . Diabetes Mother   . Hypertension Mother   . Depression Mother   . Diabetes Maternal Grandmother    History  Substance Use Topics  . Smoking status: Current Every Day Smoker -- 1.00 packs/day    Types: Cigarettes  . Smokeless tobacco: Not on file  . Alcohol Use: No   OB History   Grav Para Term Preterm Abortions TAB SAB Ect Mult Living                 Review of Systems  Constitutional: Negative for fever and chills.  Respiratory: Positive for cough, chest tightness and shortness of breath.   Cardiovascular: Negative for chest  pain.  Gastrointestinal: Negative for nausea, vomiting and abdominal pain.  All other systems reviewed and are negative.    Allergies  Review of patient's allergies indicates no active allergies.  Home Medications   Current Outpatient Rx  Name  Route  Sig  Dispense  Refill  . amoxicillin (AMOXIL) 500 MG capsule   Oral   Take 1 capsule (500 mg total) by mouth 3 (three) times daily.   21 capsule   0   . benzonatate (TESSALON) 100 MG capsule   Oral   Take 100 mg by mouth 3 (three) times daily as needed. cough         . ibuprofen (ADVIL,MOTRIN) 200 MG tablet   Oral   Take 800 mg by mouth every 6 (six) hours as needed. pain         . predniSONE (DELTASONE) 20 MG tablet      3 Tabs PO Days 1-3, then 2 tabs PO Days 4-6, then 1 tab PO Day 7-9, then Half Tab PO Day 10-12   20 tablet   0    BP 149/88  Pulse 94  Temp(Src) 97.5 F (36.4 C) (Oral)  Resp 96  Ht 5\' 8"  (1.727 m)  Wt 235 lb (106.595 kg)  BMI 35.74 kg/m2  SpO2 96%  LMP 07/10/2013 Physical Exam  Nursing note and vitals reviewed. Constitutional: She is oriented to person, place, and time. She appears well-developed and well-nourished.  No distress.  HENT:  Head: Normocephalic and atraumatic.  Right Ear: External ear normal.  Left Ear: External ear normal.  Nose: Nose normal.  Mouth/Throat: Oropharynx is clear and moist.  Eyes: Conjunctivae are normal.  Neck: Normal range of motion.  Cardiovascular: Normal rate, regular rhythm and normal heart sounds.   Pulmonary/Chest: Effort normal. No accessory muscle usage or stridor. Not tachypneic. No respiratory distress. She has wheezes (diffusely). She has no rales.  Speaking in full sentences. No respiratory distress.  Abdominal: Soft. She exhibits no distension.  Musculoskeletal: Normal range of motion.  Neurological: She is alert and oriented to person, place, and time. She has normal strength.  Skin: Skin is warm and dry. She is not diaphoretic. No erythema.   Psychiatric: She has a normal mood and affect. Her behavior is normal.    ED Course  Procedures (including critical care time) Labs Review Labs Reviewed - No data to display Imaging Review Dg Chest 2 View  07/19/2013   CLINICAL DATA:  Cough and chest tightness. History of asthma.  EXAM: CHEST  2 VIEW  COMPARISON:  Chest radiograph performed 05/16/2012  FINDINGS: The lungs are well-aerated. Mild chronic peribronchial thickening is noted. There is no evidence of focal opacification, pleural effusion or pneumothorax.  The heart is normal in size; the mediastinal contour is within normal limits. No acute osseous abnormalities are seen.  IMPRESSION: No acute cardiopulmonary process seen; mild chronic peribronchial thickening noted.   Electronically Signed   By: Roanna Raider M.D.   On: 07/19/2013 06:56    EKG Interpretation   None      7:35 AM Patient feels significantly improved and would like to go home. Will ambulate with pulse ox. If she does not desat will d/c with inhaler.   MDM   1. Asthma exacerbation    Patient ambulated in ED with O2 saturations maintained >90, no current signs of respiratory distress. Lung exam improved after nebulizer treatment. Prednisone given in the ED and pt will bd dc with 5 day burst. Pt states they are breathing at baseline. Pt has been instructed to continue using prescribed medications and to speak with PCP about today's exacerbation. She was given a referral to the wellness center. Strict return instructions given.    Mora Bellman, PA-C 07/19/13 [phone]

## 2013-07-19 NOTE — ED Notes (Signed)
Patient states asthma exacerbation x 2 days, patient states some productive cough with green mucous

## 2013-07-19 NOTE — ED Notes (Signed)
Ambulated pt per nursing order; pt ambulated well stating 96% - 98% RA with a heart rate of 89 - 93bpm

## 2013-07-19 NOTE — ED Provider Notes (Signed)
Medical screening examination/treatment/procedure(s) were performed by non-physician practitioner and as supervising physician I was immediately available for consultation/collaboration.  EKG Interpretation   None        Ethelda Chick, MD 07/19/13 [phone]

## 2014-01-18 ENCOUNTER — Emergency Department (HOSPITAL_COMMUNITY)
Admission: EM | Admit: 2014-01-18 | Discharge: 2014-01-18 | Disposition: A | Discharge status: Home / Self Care | Payer: Self-pay | Attending: Emergency Medicine | Admitting: Emergency Medicine

## 2014-01-18 ENCOUNTER — Encounter (HOSPITAL_COMMUNITY): Payer: Self-pay | Admitting: Emergency Medicine

## 2014-01-18 ENCOUNTER — Emergency Department (HOSPITAL_COMMUNITY): Payer: Self-pay

## 2014-01-18 DIAGNOSIS — Z792 Long term (current) use of antibiotics: Secondary | ICD-10-CM | POA: Insufficient documentation

## 2014-01-18 DIAGNOSIS — IMO0002 Reserved for concepts with insufficient information to code with codable children: Secondary | ICD-10-CM | POA: Insufficient documentation

## 2014-01-18 DIAGNOSIS — F17200 Nicotine dependence, unspecified, uncomplicated: Secondary | ICD-10-CM | POA: Insufficient documentation

## 2014-01-18 DIAGNOSIS — Z8639 Personal history of other endocrine, nutritional and metabolic disease: Secondary | ICD-10-CM | POA: Insufficient documentation

## 2014-01-18 DIAGNOSIS — Z862 Personal history of diseases of the blood and blood-forming organs and certain disorders involving the immune mechanism: Secondary | ICD-10-CM | POA: Insufficient documentation

## 2014-01-18 DIAGNOSIS — Z872 Personal history of diseases of the skin and subcutaneous tissue: Secondary | ICD-10-CM | POA: Insufficient documentation

## 2014-01-18 DIAGNOSIS — Z8719 Personal history of other diseases of the digestive system: Secondary | ICD-10-CM | POA: Insufficient documentation

## 2014-01-18 DIAGNOSIS — J45901 Unspecified asthma with (acute) exacerbation: Secondary | ICD-10-CM | POA: Insufficient documentation

## 2014-01-18 DIAGNOSIS — I10 Essential (primary) hypertension: Secondary | ICD-10-CM | POA: Insufficient documentation

## 2014-01-18 DIAGNOSIS — Z8659 Personal history of other mental and behavioral disorders: Secondary | ICD-10-CM | POA: Insufficient documentation

## 2014-01-18 DIAGNOSIS — E669 Obesity, unspecified: Secondary | ICD-10-CM | POA: Insufficient documentation

## 2014-01-18 MED ORDER — ALBUTEROL SULFATE HFA 108 (90 BASE) MCG/ACT IN AERS
2.0000 | INHALATION_SPRAY | Freq: Once | RESPIRATORY_TRACT | Status: AC
  Administered 2014-01-18: 2 via RESPIRATORY_TRACT
  Filled 2014-01-18: qty 6.7

## 2014-01-18 MED ORDER — ALBUTEROL SULFATE (2.5 MG/3ML) 0.083% IN NEBU
2.5000 mg | INHALATION_SOLUTION | Freq: Once | RESPIRATORY_TRACT | Status: AC
  Administered 2014-01-18: 2.5 mg via RESPIRATORY_TRACT
  Filled 2014-01-18: qty 3

## 2014-01-18 MED ORDER — PREDNISONE 20 MG PO TABS: 40.0000 mg | ORAL_TABLET | Freq: Every day | ORAL | Status: AC

## 2014-01-18 MED ORDER — MOMETASONE FURO-FORMOTEROL FUM 100-5 MCG/ACT IN AERO
2.0000 | INHALATION_SPRAY | Freq: Two times a day (BID) | RESPIRATORY_TRACT | Status: DC
  Administered 2014-01-18: 2 via RESPIRATORY_TRACT
  Filled 2014-01-18: qty 8.8

## 2014-01-18 MED ORDER — PREDNISONE 20 MG PO TABS
40.0000 mg | ORAL_TABLET | Freq: Once | ORAL | Status: AC
  Administered 2014-01-18: 40 mg via ORAL
  Filled 2014-01-18: qty 2

## 2014-01-18 MED ORDER — IPRATROPIUM BROMIDE 0.02 % IN SOLN
0.5000 mg | Freq: Once | RESPIRATORY_TRACT | Status: AC
  Administered 2014-01-18: 0.5 mg via RESPIRATORY_TRACT
  Filled 2014-01-18: qty 2.5

## 2014-01-18 NOTE — ED Provider Notes (Signed)
Medical screening examination/treatment/procedure(s) were performed by non-physician practitioner and as supervising physician I was immediately available for consultation/collaboration.   EKG Interpretation None        Shanna CiscoMegan E Docherty, MD 01/18/14 2044

## 2014-01-18 NOTE — ED Provider Notes (Signed)
CSN: [id number]     Arrival date & time 01/18/14  1154 History  This chart was scribed for non-physician practitioner working with Shanna CiscoMegan E Docherty, MD, by Jarvis Morganaylor Ferguson, ED Scribe. This patient was seen in room TR09C/TR09C and the patient's care was started at 1:28 PM..    Chief Complaint  Patient presents with  . Asthma  . Shortness of Breath    Patient is a 33 y.o. female presenting with shortness of breath. The history is provided by the patient. No language interpreter was used.  Shortness of Breath Severity:  Mild Duration:  2 days Timing:  Intermittent Chronicity:  Chronic Relieved by:  Inhaler (Albuterol) Ineffective treatments:  None tried Associated symptoms: cough and wheezing   Risk factors comment:  History of Asthma   HPI Comments: Brooke Hickman is a 33 y.o. female with a h/o Asthma and HTN who presents to the Emergency Department complaining of mild, intermittent, shortness of breath onset for 2 days. Patient has associated wheezing and cough.  Patient states that her shortness of breath is relieved by an albuterol breathing treatment. Patient states she does not have a PCP and is out of inhalers.  PCP-  None  Past Medical History  Diagnosis Date  . Hypertension   . Lipidemia   . GERD (gastroesophageal reflux disease)   . Eczema   . Obesity   . Depression   . Asthma     PFTs 2/10 FEV1/FVC 82%, lungs hypoerinflated, + response to bronchodilators   History reviewed. No pertinent past surgical history. Family History  Problem Relation Age of Onset  . Diabetes Mother   . Hypertension Mother   . Depression Mother   . Diabetes Maternal Grandmother    History  Substance Use Topics  . Smoking status: Current Every Day Smoker -- 1.00 packs/day    Types: Cigarettes  . Smokeless tobacco: Not on file  . Alcohol Use: No   OB History   Grav Para Term Preterm Abortions TAB SAB Ect Mult Living                 Review of Systems  Respiratory: Positive for  cough, shortness of breath and wheezing.   All other systems reviewed and are negative.     Allergies  Review of patient's allergies indicates no known allergies.  Home Medications   Prior to Admission medications   Medication Sig Start Date End Date Taking? Authorizing Provider  azithromycin (ZITHROMAX Z-PAK) 250 MG tablet 2 po day one, then 1 daily x 4 days 07/19/13   Mora BellmanHannah S Merrell, PA-C  ibuprofen (ADVIL,MOTRIN) 200 MG tablet Take 600 mg by mouth every 6 (six) hours as needed for headache.    Historical Provider, MD  predniSONE (DELTASONE) 20 MG tablet 3 tabs po day one, then 2 tabs daily x 4 days 07/19/13   Mora BellmanHannah S Merrell, PA-C  Pseudoephedrine-DM-GG-APAP (TYLENOL COLD SEVERE CONGESTION PO) Take 2 tablets by mouth every 8 (eight) hours as needed (for cold symptoms).    Historical Provider, MD   Triage Vitals: BP 149/123  Pulse 87  Temp(Src) 97.9 F (36.6 C) (Oral)  Resp 18  Ht 5\' 8"  (1.727 m)  Wt 257 lb 4.8 oz (116.711 kg)  BMI 39.13 kg/m2  SpO2 96%  Physical Exam  Nursing note and vitals reviewed. Constitutional: She is oriented to person, place, and time. She appears well-developed and well-nourished. No distress.  HENT:  Head: Normocephalic and atraumatic.  Right Ear: External ear normal.  Left Ear:  External ear normal.  Nose: Nose normal.  Mouth/Throat: Oropharynx is clear and moist.  Eyes: Conjunctivae are normal.  Neck: Normal range of motion. Neck supple.  Cardiovascular: Normal rate.   Pulmonary/Chest: Effort normal. She has wheezes (mild wheezing in bilateral upper lobes).  Abdominal: Soft.  Musculoskeletal: Normal range of motion.  Neurological: She is alert and oriented to person, place, and time.  Skin: Skin is warm and dry. She is not diaphoretic.  Psychiatric: She has a normal mood and affect.    ED Course  Procedures (including critical care time)  DIAGNOSTIC STUDIES: Oxygen Saturation is 96% on RA, normal by my interpretation.     COORDINATION OF CARE:    Labs Review Labs Reviewed - No data to display  Imaging Review Dg Chest 2 View  01/18/2014   CLINICAL DATA:  Cough.  Asthma.  EXAM: CHEST  2 VIEW  COMPARISON:  07/19/2013.  FINDINGS: Minimal chronic peribronchial thickening.  No infiltrate, congestive heart failure or pneumothorax.  Heart size within normal limits.  IMPRESSION: Minimal chronic peribronchial thickening.  No acute abnormality noted.   Electronically Signed   By: Bridgett LarssonSteve  Olson M.D.   On: 01/18/2014 13:27     EKG Interpretation None      MDM   Final diagnoses:  Asthma exacerbation    1:52 PM Patient's lungs improve after albuterol and atrovent nebulizer. Vitals stable and patient afebrile. Patient will have prednisone here and prescription on discharge. [employer] li[facility] will speak with the patient about PCP follow up.   I personally performed the services described in this documentation, which was scribed in my presence. The recorded information has been reviewed and is accurate.     Emilia BeckKaitlyn Donelle Hise, PA-C 01/18/14 1422

## 2014-01-18 NOTE — Discharge Planning (Signed)
[facility] Community Liaison  Spoke to patient about primary care resources and establishing care. Patient states [facility] but has not been seen there in a few years. I did attempt to make a follow up/ establish care appointment with the clinic and currently waiting on a response. Patient will be notified by phone and mail of an appointment if made with the clinic Patient was educated on the importance of establishing care and going to all appointments made. Patient was given a Facilities managerresource guide and my contact information for any future questions or concerns.

## 2014-01-18 NOTE — Discharge Instructions (Signed)
Take Prednisone as directed until gone. Use albuterol inhaler as needed. [facility]

## 2014-01-18 NOTE — ED Notes (Addendum)
Pt reports having hx of asthma, doesn't have pcp and is out of inhalers. Having sob x 2 days, audible wheezing noted at triage, spo2 96% at triage. Also has been out of bp meds.

## 2014-01-18 NOTE — ED Notes (Signed)
Pt states she feels better.

## 2014-01-18 NOTE — ED Notes (Signed)
Pt has hx of Asthma and HTN. Not currently on meds because does not have a PCP. Started 2 days ago with wheezing and coughing.

## 2014-07-29 IMAGING — CR DG CHEST 2V
2 series · 2 of 2 positions shown · non-contrast
Comparison: 07/19/2013.

CLINICAL DATA: Cough.  Asthma.

EXAM:
CHEST  2 VIEW

[w chest pa]
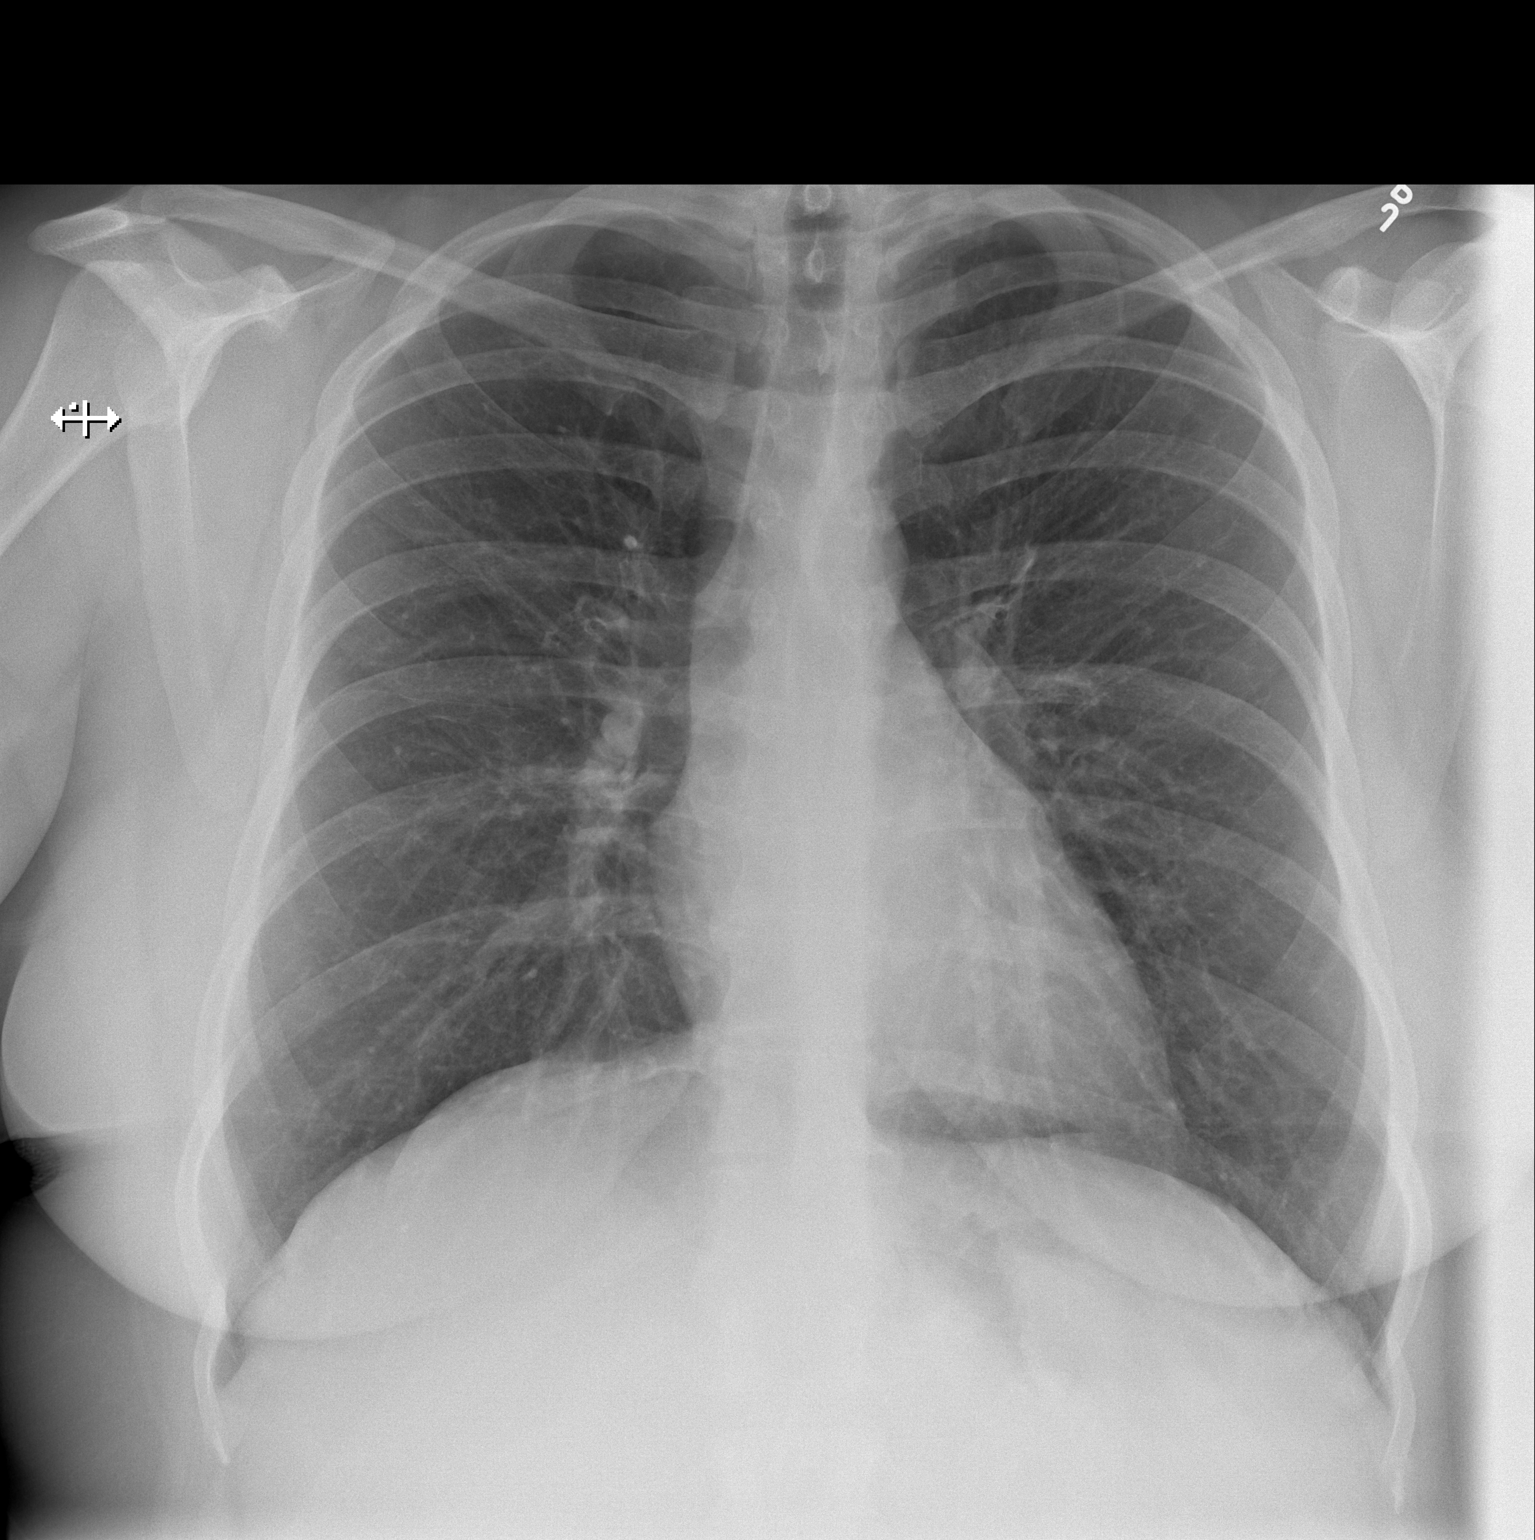

[w chest lat]
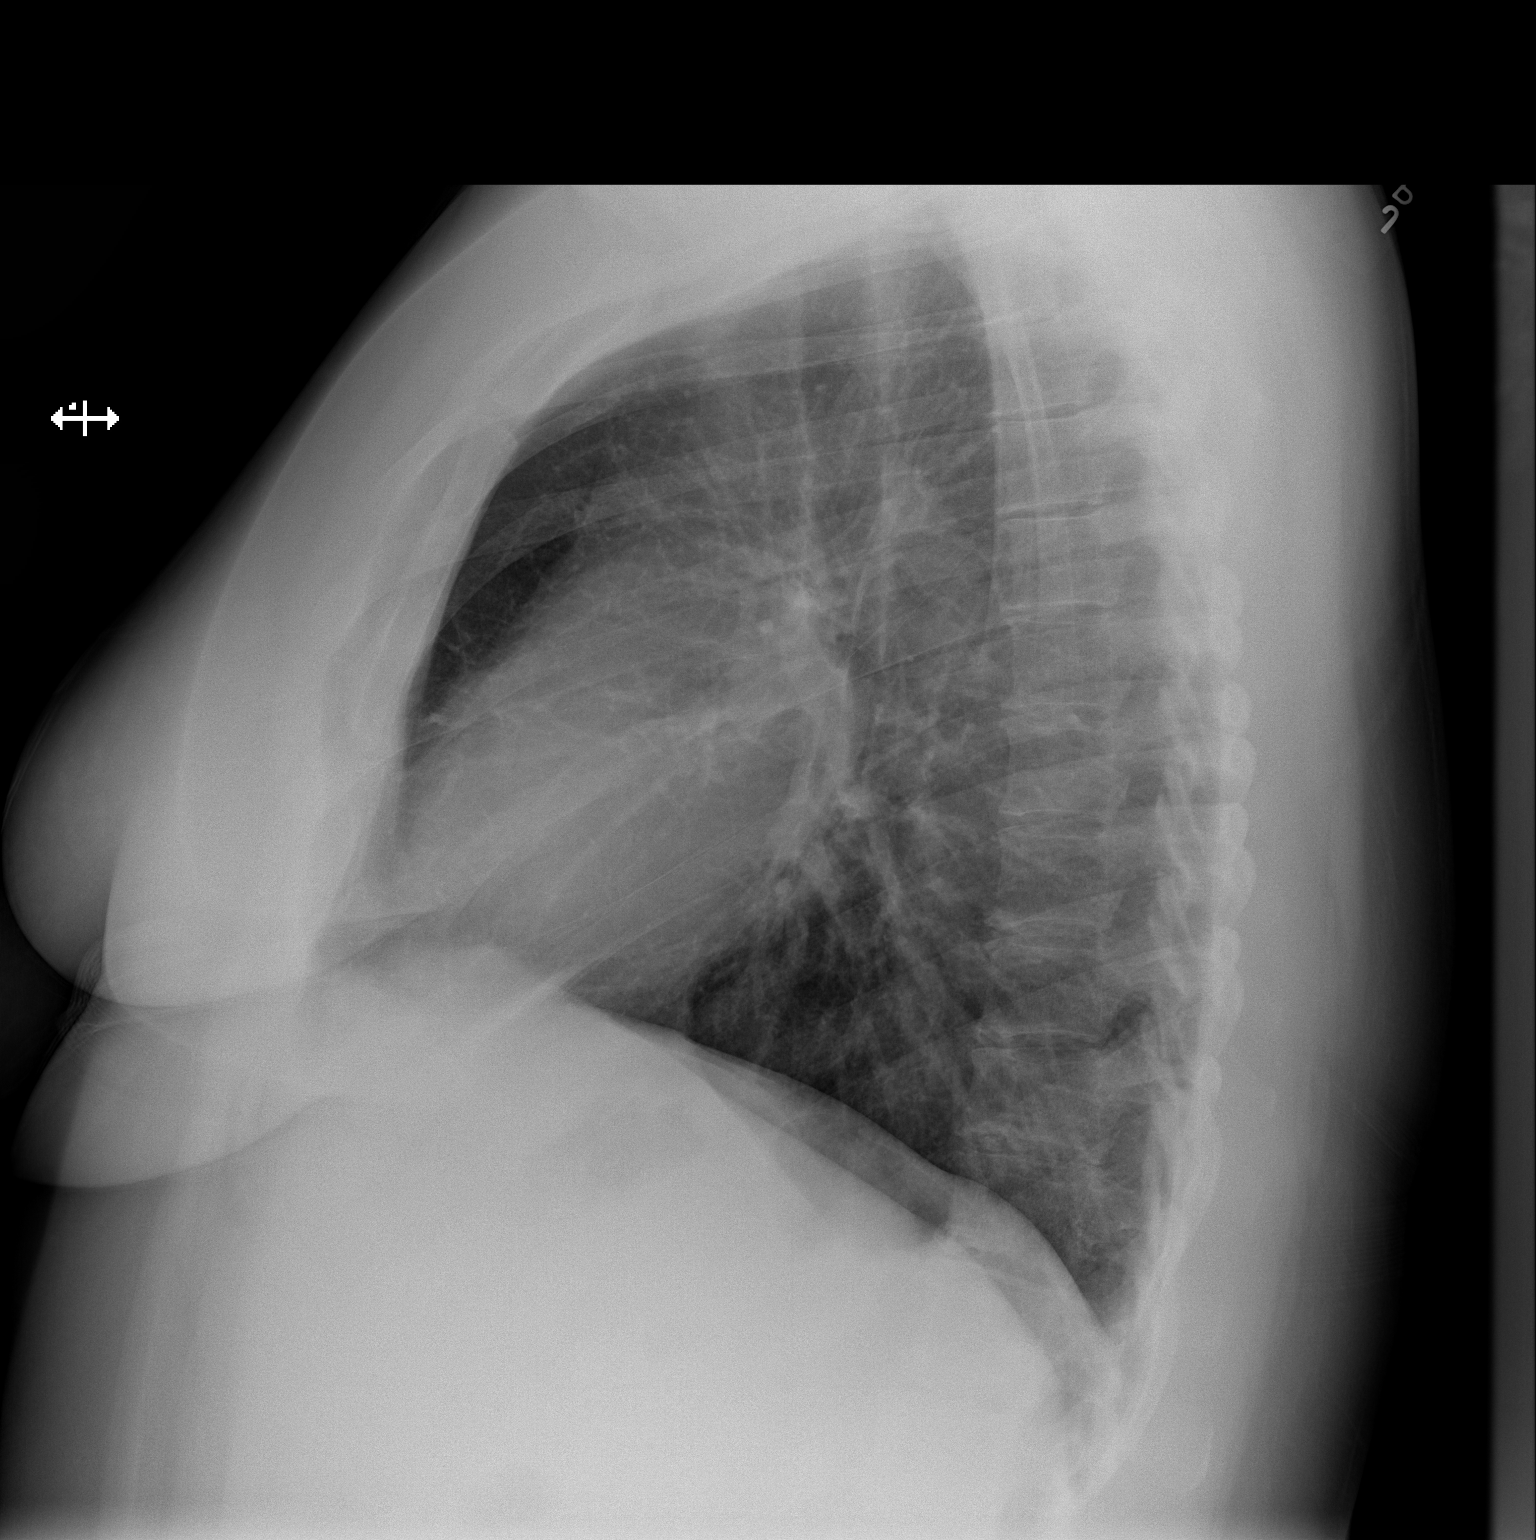

[2 of 2 positions shown; findings below may reference images not displayed]

FINDINGS: Minimal chronic peribronchial thickening.

No infiltrate, congestive heart failure or pneumothorax.

Heart size within normal limits.
IMPRESSION: Minimal chronic peribronchial thickening.

No acute abnormality noted.

## 2019-04-14 ENCOUNTER — Other Ambulatory Visit: Payer: Self-pay

## 2019-04-14 ENCOUNTER — Emergency Department
Admission: EM | Admit: 2019-04-14 | Discharge: 2019-04-14 | Disposition: A | Discharge status: Home / Self Care | Payer: Self-pay | Attending: Emergency Medicine | Admitting: Emergency Medicine

## 2019-04-14 DIAGNOSIS — F17210 Nicotine dependence, cigarettes, uncomplicated: Secondary | ICD-10-CM | POA: Insufficient documentation

## 2019-04-14 DIAGNOSIS — I10 Essential (primary) hypertension: Secondary | ICD-10-CM | POA: Insufficient documentation

## 2019-04-14 DIAGNOSIS — J45909 Unspecified asthma, uncomplicated: Secondary | ICD-10-CM | POA: Insufficient documentation

## 2019-04-14 DIAGNOSIS — Z79899 Other long term (current) drug therapy: Secondary | ICD-10-CM | POA: Insufficient documentation

## 2019-04-14 DIAGNOSIS — K047 Periapical abscess without sinus: Secondary | ICD-10-CM | POA: Insufficient documentation

## 2019-04-14 MED ORDER — AMOXICILLIN-POT CLAVULANATE 875-125 MG PO TABS
1.0000 | ORAL_TABLET | Freq: Once | ORAL | Status: AC
  Administered 2019-04-14: 1 via ORAL
  Filled 2019-04-14: qty 1

## 2019-04-14 MED ORDER — AMOXICILLIN-POT CLAVULANATE 875-125 MG PO TABS: 1.0000 | ORAL_TABLET | Freq: Two times a day (BID) | ORAL | 0 refills | Status: AC

## 2019-04-14 NOTE — ED Notes (Signed)
Patient was sent home from work due to fever 99.5. Patient states she has an abscess tooth and is thinking that is the cause. Per patient Tooth on right bottom is broken and when patient pushes on area she gets a funny taste in mouth.

## 2019-04-14 NOTE — ED Triage Notes (Signed)
Pt arrives to ED via POV after work with c/o lower right-sided dental pain x4 days. Pt states she was given ABX by a friend, that "helped a little bit", but did not completely resolve the issue.

## 2019-04-14 NOTE — ED Notes (Signed)
Pt's BP found to be significantly elevated in Triage. Pt reports h/x of HTN, but reports she does not take any r/x'd antihypertension medications d/t not having a PCP at this time.

## 2019-04-14 NOTE — ED Provider Notes (Signed)
[facility] Emergency Department Provider Note ____________________________________________   First MD Initiated Contact with Patient 04/14/19 [phone]     (approximate)  I have reviewed the triage vital signs and the nursing notes.   HISTORY  Chief Complaint Dental Pain    HPI Brooke Hickman is a 38 y.o. female with PMH as noted below who presents with dental pain to a right lower molar over the last few days.  The patient states that she has had a low-grade temperature and was sent home from work today because of it, due to the coronavirus pandemic.  The patient has a decayed tooth and states that it feels like she has a dental abscess.  She states that she took a leftover antibiotic from a friend but does not know which antibiotic it was or the dose.  Past Medical History:  Diagnosis Date  . Asthma    PFTs 2/10 FEV1/FVC 82%, lungs hypoerinflated, + response to bronchodilators  . Depression   . Eczema   . GERD (gastroesophageal reflux disease)   . Hypertension   . Lipidemia   . Obesity     Patient Active Problem List   Diagnosis Date Noted  . OTH D/O MENSTRUATION&OTH ABN BLEED FE GNT TRACT 08/25/2009  . JOINT EFFUSION, ANKLE 03/16/2009  . BUNIONS, RIGHT FOOT 03/16/2009  . FLAT FOOT 03/16/2009  . FRACTURE, ANKLE, RIGHT 03/16/2009  . ANKLE PAIN, RIGHT 02/13/2009  . OBESITY 10/03/2008  . ECZEMA 10/03/2008  . ALLERGIC RHINITIS 05/04/2008  . ASTHMA 09/25/2007  . DEPRESSION 01/07/2007  . DYSLIPIDEMIA 09/04/2006  . TOBACCO ABUSE 09/04/2006  . HYPERTENSION 09/04/2006  . GERD 09/04/2006  . CHEST PAIN, ATYPICAL 09/04/2006    History reviewed. No pertinent surgical history.  Prior to Admission medications   Medication Sig Start Date End Date Taking? Authorizing Provider  amoxicillin-clavulanate (AUGMENTIN) 875-125 MG tablet Take 1 tablet by mouth 2 (two) times daily for 7 days. 04/14/19 04/21/19  Dionne BucySiadecki, Taila Basinski, MD  azithromycin (ZITHROMAX Z-PAK)  250 MG tablet 2 po day one, then 1 daily x 4 days 07/19/13   Junious SilkMerrell, Hannah, PA-C  ibuprofen (ADVIL,MOTRIN) 200 MG tablet Take 600 mg by mouth every 6 (six) hours as needed for headache.    [provider]  predniSONE (DELTASONE) 20 MG tablet 3 tabs po day one, then 2 tabs daily x 4 days 07/19/13   Junious SilkMerrell, Hannah, PA-C  predniSONE (DELTASONE) 20 MG tablet Take 2 tablets (40 mg total) by mouth daily. 01/18/14   Szekalski, Kaitlyn, PA-C  Pseudoephedrine-DM-GG-APAP (TYLENOL COLD SEVERE CONGESTION PO) Take 2 tablets by mouth every 8 (eight) hours as needed (for cold symptoms).    [provider]    Allergies Patient has no known allergies.  Family History  Problem Relation Age of Onset  . Diabetes Mother   . Hypertension Mother   . Depression Mother   . Diabetes Maternal Grandmother     Social History Social History   Tobacco Use  . Smoking status: Current Every Day Smoker    Packs/day: 1.00    Types: Cigarettes  . Smokeless tobacco: Never Used  Substance Use Topics  . Alcohol use: No  . Drug use: No    Review of Systems  Constitutional: Positive for low-grade fever. Eyes: No redness. ENT: No sore throat. Cardiovascular: Denies chest pain. Respiratory: Denies shortness of breath or cough. Gastrointestinal: No vomiting or diarrhea.  Genitourinary: Negative for dysuria.  Musculoskeletal: Negative for back pain. Skin: Negative for rash. Neurological: Negative for  headache.   ____________________________________________   PHYSICAL EXAM:  VITAL SIGNS: ED Triage Vitals  Enc Vitals Group     BP 04/14/19 0439 (!) 180/97     Pulse Rate 04/14/19 0439 83     Resp 04/14/19 0439 16     Temp 04/14/19 0439 98.6 F (37 C)     Temp Source 04/14/19 0439 Oral     SpO2 04/14/19 0439 99 %     Weight 04/14/19 0437 240 lb (108.9 kg)     Height 04/14/19 0437 5\' 9"  (1.753 m)     Head Circumference --      Peak Flow --      Pain Score 04/14/19 0437 6     Pain Loc  --      Pain Edu? --      Excl. in [place]? --     Constitutional: Alert and oriented. Well appearing and in no acute distress. Eyes: Conjunctivae are normal.  Head: Atraumatic. Nose: No congestion/rhinnorhea. Mouth/Throat: Mucous membranes are moist.  Decayed right lower molar with small amount of purulent drainage.  No significant fluctuance or palpable abscess.  Oropharynx clear. Neck: Normal range of motion.  Cardiovascular: Good peripheral circulation. Respiratory: Normal respiratory effort.  No retractions. Gastrointestinal: No distention.  Musculoskeletal:  Extremities warm and well perfused.  Neurologic:  Normal speech and language. No gross focal neurologic deficits are appreciated.  Skin:  Skin is warm and dry. No rash noted. Psychiatric: Mood and affect are normal. Speech and behavior are normal.  ____________________________________________   LABS (all labs ordered are listed, but only abnormal results are displayed)  Labs Reviewed - No data to display ____________________________________________  EKG   ____________________________________________  RADIOLOGY    ____________________________________________   PROCEDURES  Procedure(s) performed: No  Procedures  Critical Care performed: No ____________________________________________   INITIAL IMPRESSION / ASSESSMENT AND PLAN / ED COURSE  Pertinent labs & imaging results that were available during my care of the patient were reviewed by me and considered in my medical decision making (see chart for details).  38 year old female with PMH as noted above presents with symptoms consistent with a dental abscess.  The patient has pain to a right lower molar.  On exam, she has a decayed tooth there and I was able to express a small amount of pus ,although there is no significant fluctuance or any collection that requires I&D.  It is consistent with a small abscess that is spontaneously draining.  I will put the  patient on Augmentin and give her information for local dental clinics.  The patient has no signs or symptoms of COVID-19.  ______________________________  Brooke Hickman was evaluated in Emergency Department on 04/14/2019 for the symptoms described in the history of present illness. She was evaluated in the context of the global COVID-19 pandemic, which necessitated consideration that the patient might be at risk for infection with the SARS-CoV-2 virus that causes COVID-19. Institutional protocols and algorithms that pertain to the evaluation of patients at risk for COVID-19 are in a state of rapid change based on information released by regulatory bodies including the CDC and federal and state organizations. These policies and algorithms were followed during the patient's care in the ED.  ____________________________________________   FINAL CLINICAL IMPRESSION(S) / ED DIAGNOSES  Final diagnoses:  Dental abscess      NEW MEDICATIONS STARTED DURING THIS VISIT:  New Prescriptions   AMOXICILLIN-CLAVULANATE (AUGMENTIN) 875-125 MG TABLET    Take 1 tablet by mouth 2 (two) times daily  for 7 days.     Note:  This document was prepared using Dragon voice recognition software and may include unintentional dictation errors.   Dionne BucySiadecki, Tracen Mahler, MD 04/14/19 [phone]

## 2019-04-14 NOTE — ED Notes (Signed)
ED Provider at bedside. 

## 2019-04-14 NOTE — Discharge Instructions (Signed)
Take the antibiotic as prescribed and finish the full course or at least until you follow-up with a dentist.  We have provided a list of dental clinics for you to follow-up in.  Return to the ER for new or worsening pain, swelling, difficulty swallowing, fever, weakness, or any other new or worsening symptoms that concern you.

## 2019-07-05 ENCOUNTER — Encounter: Payer: Self-pay | Admitting: Emergency Medicine

## 2019-07-05 ENCOUNTER — Emergency Department
Admission: EM | Admit: 2019-07-05 | Discharge: 2019-07-05 | Disposition: A | Discharge status: Home / Self Care | Payer: Self-pay | Attending: Emergency Medicine | Admitting: Emergency Medicine

## 2019-07-05 ENCOUNTER — Other Ambulatory Visit: Payer: Self-pay

## 2019-07-05 DIAGNOSIS — I10 Essential (primary) hypertension: Secondary | ICD-10-CM | POA: Insufficient documentation

## 2019-07-05 DIAGNOSIS — J45909 Unspecified asthma, uncomplicated: Secondary | ICD-10-CM | POA: Insufficient documentation

## 2019-07-05 DIAGNOSIS — F17210 Nicotine dependence, cigarettes, uncomplicated: Secondary | ICD-10-CM | POA: Insufficient documentation

## 2019-07-05 LAB — URINALYSIS, COMPLETE (UACMP) WITH MICROSCOPIC
Bacteria, UA: NONE SEEN
Bilirubin Urine: NEGATIVE
Glucose, UA: NEGATIVE mg/dL
Hgb urine dipstick: NEGATIVE
Ketones, ur: NEGATIVE mg/dL
Leukocytes,Ua: NEGATIVE
Nitrite: NEGATIVE
Protein, ur: NEGATIVE mg/dL
Specific Gravity, Urine: 1.015 (ref 1.005–1.030)
pH: 7 (ref 5.0–8.0)

## 2019-07-05 LAB — CBC
HCT: 41.9 % (ref 36.0–46.0)
Hemoglobin: 13.7 g/dL (ref 12.0–15.0)
MCH: 28.6 pg (ref 26.0–34.0)
MCHC: 32.7 g/dL (ref 30.0–36.0)
MCV: 87.5 fL (ref 80.0–100.0)
Platelets: 333 10*3/uL (ref 150–400)
RBC: 4.79 MIL/uL (ref 3.87–5.11)
RDW: 14 % (ref 11.5–15.5)
WBC: 11 10*3/uL — ABNORMAL HIGH (ref 4.0–10.5)
nRBC: 0 % (ref 0.0–0.2)

## 2019-07-05 LAB — BASIC METABOLIC PANEL
Anion gap: 8 (ref 5–15)
BUN: 14 mg/dL (ref 6–20)
CO2: 24 mmol/L (ref 22–32)
Calcium: 9.1 mg/dL (ref 8.9–10.3)
Chloride: 105 mmol/L (ref 98–111)
Creatinine, Ser: 0.62 mg/dL (ref 0.44–1.00)
GFR calc Af Amer: >60 mL/min (ref >60)
GFR calc non Af Amer: >60 mL/min (ref >60)
Glucose, Bld: 96 mg/dL (ref 70–99)
Potassium: 4.2 mmol/L (ref 3.5–5.1)
Sodium: 137 mmol/L (ref 135–145)

## 2019-07-05 LAB — POCT PREGNANCY, URINE: Preg Test, Ur: NEGATIVE

## 2019-07-05 LAB — TROPONIN I (HIGH SENSITIVITY): Troponin I (High Sensitivity): <2 ng/L (ref <18)

## 2019-07-05 MED ORDER — HYDROCHLOROTHIAZIDE 12.5 MG PO CAPS: 12.5000 mg | ORAL_CAPSULE | Freq: Every day | ORAL | 2 refills | Status: AC

## 2019-07-05 MED ORDER — HYDROCHLOROTHIAZIDE 12.5 MG PO CAPS: 12.5000 mg | ORAL_CAPSULE | Freq: Every day | ORAL | 2 refills | Status: DC

## 2019-07-05 MED ORDER — PREDNISONE 10 MG (21) PO TBPK: ORAL_TABLET | ORAL | 0 refills | Status: DC

## 2019-07-05 MED ORDER — ALBUTEROL SULFATE HFA 108 (90 BASE) MCG/ACT IN AERS: 2.0000 | INHALATION_SPRAY | Freq: Four times a day (QID) | RESPIRATORY_TRACT | 1 refills | Status: AC | PRN

## 2019-07-05 MED ORDER — ALBUTEROL SULFATE HFA 108 (90 BASE) MCG/ACT IN AERS: 2.0000 | INHALATION_SPRAY | Freq: Four times a day (QID) | RESPIRATORY_TRACT | 1 refills | Status: DC | PRN

## 2019-07-05 MED ORDER — PREDNISONE 10 MG (21) PO TBPK: ORAL_TABLET | ORAL | 0 refills | Status: AC

## 2019-07-05 NOTE — ED Notes (Signed)
Per Dr. Jari Pigg, add on Trop to bloodwork.

## 2019-07-05 NOTE — Discharge Instructions (Signed)
Please seek medical attention for any high fevers, chest pain, shortness of breath, change in behavior, persistent vomiting, bloody stool or any other new or concerning symptoms.  

## 2019-07-05 NOTE — ED Triage Notes (Signed)
Pt presents to ED via POV, pt states "I just feel bad". Pt states her blood pressure has been elevated, c/o fatigue and dizziness with elevated BP. Pt also c/o SOB, states hx of asthma and is out of her inhaler.   Pt also states hx of HTN, has been out of her BP meds due to having no PCP.   Pt A&O x4, able to speak in full and complete sentences at this time.

## 2019-07-05 NOTE — ED Provider Notes (Signed)
[facility] Emergency Department Provider Note  ____________________________________________   I have reviewed the triage vital signs and the nursing notes.   HISTORY  Chief Complaint Hypertension, Dizziness, and Shortness of Breath   History limited by: Not Limited   HPI Brooke Hickman is a 38 y.o. female who presents to the emergency department today with primary concerns for high blood pressure and asthma.  Patient states that she has a long history of high blood pressure.  She has not been on medication for years because she has not had a primary care physician.  She says that when her blood pressure gets high she starts feeling dizzy.  She has been feeling this way recently.  Additionally she has concerns for some shortness of breath and asthma.  She states she has a history of asthma but has not had her medications for the past 2 to 3 weeks.  This is the length of time where she has felt short of breath.  She has had some associated chest discomfort.  The patient not had any measured fevers.   Records reviewed. Per medical record review patient has a history of asthma, hypertension.  Past Medical History:  Diagnosis Date  . Asthma    PFTs 2/10 FEV1/FVC 82%, lungs hypoerinflated, + response to bronchodilators  . Depression   . Eczema   . GERD (gastroesophageal reflux disease)   . Hypertension   . Lipidemia   . Obesity     Patient Active Problem List   Diagnosis Date Noted  . OTH D/O MENSTRUATION&OTH ABN BLEED FE GNT TRACT 08/25/2009  . JOINT EFFUSION, ANKLE 03/16/2009  . BUNIONS, RIGHT FOOT 03/16/2009  . FLAT FOOT 03/16/2009  . FRACTURE, ANKLE, RIGHT 03/16/2009  . ANKLE PAIN, RIGHT 02/13/2009  . OBESITY 10/03/2008  . ECZEMA 10/03/2008  . ALLERGIC RHINITIS 05/04/2008  . ASTHMA 09/25/2007  . DEPRESSION 01/07/2007  . DYSLIPIDEMIA 09/04/2006  . TOBACCO ABUSE 09/04/2006  . HYPERTENSION 09/04/2006  . GERD 09/04/2006  . CHEST PAIN, ATYPICAL  09/04/2006    History reviewed. No pertinent surgical history.  Prior to Admission medications   Medication Sig Start Date End Date Taking? Authorizing Provider  azithromycin (ZITHROMAX Z-PAK) 250 MG tablet 2 po day one, then 1 daily x 4 days 07/19/13   Cleatrice Burke, PA-C  ibuprofen (ADVIL,MOTRIN) 200 MG tablet Take 600 mg by mouth every 6 (six) hours as needed for headache.    [provider]  predniSONE (DELTASONE) 20 MG tablet 3 tabs po day one, then 2 tabs daily x 4 days 07/19/13   Cleatrice Burke, PA-C  predniSONE (DELTASONE) 20 MG tablet Take 2 tablets (40 mg total) by mouth daily. 01/18/14   Szekalski, Kaitlyn, PA-C  Pseudoephedrine-DM-GG-APAP (TYLENOL COLD SEVERE CONGESTION PO) Take 2 tablets by mouth every 8 (eight) hours as needed (for cold symptoms).    [provider]    Allergies Patient has no known allergies.  Family History  Problem Relation Age of Onset  . Diabetes Mother   . Hypertension Mother   . Depression Mother   . Diabetes Maternal Grandmother     Social History Social History   Tobacco Use  . Smoking status: Current Every Day Smoker    Packs/day: 1.00    Types: Cigarettes  . Smokeless tobacco: Never Used  Substance Use Topics  . Alcohol use: No  . Drug use: No    Review of Systems Constitutional: No fever/chills Eyes: No visual changes. ENT: No sore throat. Cardiovascular: Denies chest  pain. Respiratory: Positive for shortness of breath. Gastrointestinal: No abdominal pain.  No nausea, no vomiting.  No diarrhea.   Genitourinary: Negative for dysuria. Musculoskeletal: Negative for back pain. Skin: Negative for rash. Neurological: Positive for dizziness. ____________________________________________   PHYSICAL EXAM:  VITAL SIGNS: ED Triage Vitals [07/05/19 1259]  Enc Vitals Group     BP (!) 150/84     Pulse Rate 72     Resp 18     Temp 98.6 F (37 C)     Temp Source Oral     SpO2 94 %     Weight 240 lb (108.9  kg)     Height 5' 8.5" (1.74 m)     Head Circumference      Peak Flow      Pain Score 8   Constitutional: Alert and oriented.  Eyes: Conjunctivae are normal.  ENT      Head: Normocephalic and atraumatic.      Nose: No congestion/rhinnorhea.      Mouth/Throat: Mucous membranes are moist.      Neck: No stridor. Hematological/Lymphatic/Immunilogical: No cervical lymphadenopathy. Cardiovascular: Normal rate, regular rhythm.  No murmurs, rubs, or gallops.  Respiratory: Normal respiratory effort without tachypnea nor retractions. Diffuse expiratory wheezing. Gastrointestinal: Soft and non tender. No rebound. No guarding.  Genitourinary: Deferred Musculoskeletal: Normal range of motion in all extremities. No lower extremity edema. Neurologic:  Normal speech and language. No gross focal neurologic deficits are appreciated.  Skin:  Skin is warm, dry and intact. No rash noted. Psychiatric: Mood and affect are normal. Speech and behavior are normal. Patient exhibits appropriate insight and judgment.  ____________________________________________    LABS (pertinent positives/negatives)  Upreg negative Trop hs <2 BMP wnl UA clear, unremarkable CBC wbc 11.0, hgb 13.7, plt 333 ____________________________________________   EKG  I, Phineas Semen, attending physician, personally viewed and interpreted this EKG  EKG Time: 1304 Rate: 71 Rhythm: normal sinus rhythm Axis: normal Intervals: qtc 389 QRS: low voltage, q waves v1 ST changes: no st elevation Impression: abnormal ekg ____________________________________________    RADIOLOGY  None  ____________________________________________   PROCEDURES  Procedures  ____________________________________________   INITIAL IMPRESSION / ASSESSMENT AND PLAN / ED COURSE  Pertinent labs & imaging results that were available during my care of the patient were reviewed by me and considered in my medical decision making (see chart for  details).   Patient presented to the emergency department today because of concerns for both high blood pressure and asthma exacerbation.  Patient's blood work without concerning abnormalities.  Patient did have some mild diffuse wheezing on exam.  Will plan on giving patient prescription for albuterol inhaler.  Will give patient prescription for blood pressure medications.  Will give patient multiple primary care clinic options.  ____________________________________________   FINAL CLINICAL IMPRESSION(S) / ED DIAGNOSES  Final diagnoses:  Hypertension, unspecified type  Asthma, unspecified asthma severity, unspecified whether complicated, unspecified whether persistent     Note: This dictation was prepared with Dragon dictation. Any transcriptional errors that result from this process are unintentional     Phineas Semen, MD 07/05/19 1555

## 2019-07-05 NOTE — ED Triage Notes (Signed)
Says she has high bp and she doesn't feel well.  Says short of breath and is out of inhaler.  She is alert and in nad currently.

## 2020-03-15 ENCOUNTER — Telehealth: Payer: Self-pay | Admitting: General Practice

## 2020-03-15 NOTE — Telephone Encounter (Signed)
Individual has been contacted 3+ times regarding ED referral. The voicemail box has not been set up. No further attempts to contact individual will be made.

## 2021-10-09 ENCOUNTER — Ambulatory Visit: Payer: Self-pay | Admitting: Emergency Medicine

## 2024-09-20 ENCOUNTER — Other Ambulatory Visit: Payer: Self-pay | Admitting: Internal Medicine

## 2024-09-20 DIAGNOSIS — Z1231 Encounter for screening mammogram for malignant neoplasm of breast: Secondary | ICD-10-CM
